# Patient Record
Sex: Female | Born: 1995 | Race: Black or African American | Hispanic: No | Marital: Single | Smoking: Never smoker
Health system: Southern US, Community
[De-identification: ages and names within clinical notes are randomized; demographics above are authoritative.]

## PROBLEM LIST (undated history)

## (undated) ENCOUNTER — Inpatient Hospital Stay (HOSPITAL_COMMUNITY): Payer: Self-pay

## (undated) DIAGNOSIS — O139 Gestational [pregnancy-induced] hypertension without significant proteinuria, unspecified trimester: Secondary | ICD-10-CM

## (undated) HISTORY — PX: NO PAST SURGERIES: SHX2092

---
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
## 2016-02-24 ENCOUNTER — Encounter: Payer: Self-pay | Admitting: Emergency Medicine

## 2016-02-24 ENCOUNTER — Emergency Department: Payer: Medicaid Other

## 2016-02-24 ENCOUNTER — Emergency Department
Admission: EM | Admit: 2016-02-24 | Discharge: 2016-02-24 | Disposition: A | Discharge status: Home / Self Care | Payer: Medicaid Other | Attending: Emergency Medicine | Admitting: Emergency Medicine

## 2016-02-24 DIAGNOSIS — S62639B Displaced fracture of distal phalanx of unspecified finger, initial encounter for open fracture: Secondary | ICD-10-CM

## 2016-02-24 DIAGNOSIS — W228XXA Striking against or struck by other objects, initial encounter: Secondary | ICD-10-CM | POA: Insufficient documentation

## 2016-02-24 DIAGNOSIS — S62631B Displaced fracture of distal phalanx of left index finger, initial encounter for open fracture: Secondary | ICD-10-CM | POA: Insufficient documentation

## 2016-02-24 DIAGNOSIS — Y999 Unspecified external cause status: Secondary | ICD-10-CM | POA: Insufficient documentation

## 2016-02-24 DIAGNOSIS — Y939 Activity, unspecified: Secondary | ICD-10-CM | POA: Insufficient documentation

## 2016-02-24 DIAGNOSIS — Y929 Unspecified place or not applicable: Secondary | ICD-10-CM | POA: Insufficient documentation

## 2016-02-24 MED ORDER — SULFAMETHOXAZOLE-TRIMETHOPRIM 800-160 MG PO TABS
1.0000 | ORAL_TABLET | Freq: Once | ORAL | Status: AC
  Administered 2016-02-24: 1 via ORAL
  Filled 2016-02-24: qty 1

## 2016-02-24 MED ORDER — OXYCODONE-ACETAMINOPHEN 5-325 MG PO TABS
2.0000 | ORAL_TABLET | Freq: Once | ORAL | Status: AC
  Administered 2016-02-24: 2 via ORAL
  Filled 2016-02-24: qty 2

## 2016-02-24 MED ORDER — OXYCODONE-ACETAMINOPHEN 7.5-325 MG PO TABS: 1.0000 | ORAL_TABLET | ORAL | 0 refills | Status: DC | PRN

## 2016-02-24 MED ORDER — SULFAMETHOXAZOLE-TRIMETHOPRIM 800-160 MG PO TABS: 1.0000 | ORAL_TABLET | Freq: Two times a day (BID) | ORAL | 0 refills | Status: DC

## 2016-02-24 MED ORDER — LIDOCAINE HCL (PF) 1 % IJ SOLN
5.0000 mL | Freq: Once | INTRAMUSCULAR | Status: AC
  Administered 2016-02-24: 5 mL via INTRADERMAL

## 2016-02-24 MED ORDER — LIDOCAINE HCL (PF) 1 % IJ SOLN
INTRAMUSCULAR | Status: AC
  Administered 2016-02-24: 5 mL via INTRADERMAL
  Filled 2016-02-24: qty 5

## 2016-02-24 NOTE — ED Triage Notes (Signed)
Patient states that about 20 minutes PTA she accidentally slammed her finger in the car door. Tip of patients left index finger was cut off when it was slammed in door.

## 2016-02-24 NOTE — ED Provider Notes (Signed)
[facility] Emergency Department Provider Note   ____________________________________________   None    (approximate)  I have reviewed the triage vital signs and the nursing notes.   HISTORY  Chief Complaint Finger Injury    HPI Tami Murphy is a 21 y.o. female patient complain distal amputation of left index finger. Patient stated pain was "in a car door. Patient arrived with amputated part. Incident occurred approximately 20 minutes prior to arrival. Amputated part was placed on ice. Patient is right-hand dominant.Patient requests reattachment of amputation of part. Patient rates the pain as 8/10. Patient described a pain as "sharp". No palliative measures prior to arrival except for bandage. Patient is right-hand dominant.   History reviewed. No pertinent past medical history.  There are no active problems to display for this patient.   History reviewed. No pertinent surgical history.  Prior to Admission medications   Medication Sig Start Date End Date Taking? Authorizing Provider  naproxen (NAPROSYN) 500 MG tablet Take 1 tablet (500 mg total) by mouth 2 (two) times daily with a meal. 07/01/15   Jami L Hagler, PA-C  oxyCODONE-acetaminophen (PERCOCET) 7.5-325 MG tablet Take 1 tablet by mouth every 4 (four) hours as needed for severe pain. 02/24/16   Joni Reiningonald K Smith, PA-C  sulfamethoxazole-trimethoprim (BACTRIM DS,SEPTRA DS) 800-160 MG tablet Take 1 tablet by mouth 2 (two) times daily. 02/24/16   Joni Reiningonald K Smith, PA-C    Allergies Patient has no known allergies.  No family history on file.  Social History Social History  Substance Use Topics  . Smoking status: Never Smoker  . Smokeless tobacco: Never Used  . Alcohol use No    Review of Systems Constitutional: No fever/chills Eyes: No visual changes. ENT: No sore throat. Cardiovascular: Denies chest pain. Respiratory: Denies shortness of breath. Gastrointestinal: No abdominal pain.  No  nausea, no vomiting.  No diarrhea.  No constipation. Genitourinary: Negative for dysuria. Musculoskeletal: Negative for back pain. Skin: Negative for rash. Neurological: Negative for headaches, focal weakness or numbness.    ____________________________________________   PHYSICAL EXAM:  VITAL SIGNS: ED Triage Vitals  Enc Vitals Group     BP 02/24/16 1418 134/79     Pulse Rate 02/24/16 1418 (!) 120     Resp 02/24/16 1418 16     Temp 02/24/16 1418 98.2 F (36.8 C)     Temp Source 02/24/16 1418 Oral     SpO2 02/24/16 1418 100 %     Weight 02/24/16 1416 170 lb (77.1 kg)     Height 02/24/16 1416 5\' 6"  (1.676 m)     Head Circumference --      Peak Flow --      Pain Score 02/24/16 1416 8     Pain Loc --      Pain Edu? --      Excl. in GC? --     Constitutional: Alert and oriented. Well appearing and in no acute distress. Eyes: Conjunctivae are normal. PERRL. EOMI. Head: Atraumatic. Nose: No congestion/rhinnorhea. Mouth/Throat: Mucous membranes are moist.  Oropharynx non-erythematous. Neck: No stridor.  No cervical spine tenderness to palpation. Hematological/Lymphatic/Immunilogical: No cervical lymphadenopathy. Cardiovascular: Normal rate, regular rhythm. Grossly normal heart sounds.  Good peripheral circulation. Respiratory: Normal respiratory effort.  No retractions. Lungs CTAB. Gastrointestinal: Soft and nontender. No distention. No abdominal bruits. No CVA tenderness. Musculoskeletal: No lower extremity tenderness nor edema.  No joint effusions. Neurologic:  Normal speech and language. No gross focal neurologic deficits are appreciated. No gait instability.  Skin:  Skin is warm, dry and intact. No rash noted. Psychiatric: Mood and affect are normal. Speech and behavior are normal.  ____________________________________________   LABS (all labs ordered are listed, but only abnormal results are displayed)  Labs Reviewed - No data to  display ____________________________________________  EKG   ____________________________________________  RADIOLOGY  X-ray shows too fracture with avulsion of soft tissue to the distal second digit left hand. ____________________________________________   PROCEDURES  Procedure(s) performed: None  Procedures  Critical Care performed: No  ____________________________________________   INITIAL IMPRESSION / ASSESSMENT AND PLAN / ED COURSE  Pertinent labs & imaging results that were available during my care of the patient were reviewed by me and considered in my medical decision making (see chart for details). Patient has open tuft fracture second digit left hand. Patient given discharge care instructions. Patient advised to wear splint May change dressing to evaluation by orthopedics. Advised call orthopedics in 2 days to schedule an appointment. Patient given a prescription for Bactrim DS and Percocets.      ____________________________________________   FINAL CLINICAL IMPRESSION(S) / ED DIAGNOSES  Final diagnoses:  Open fracture of tuft of distal phalanx of finger      NEW MEDICATIONS STARTED DURING THIS VISIT:  New Prescriptions   OXYCODONE-ACETAMINOPHEN (PERCOCET) 7.5-325 MG TABLET    Take 1 tablet by mouth every 4 (four) hours as needed for severe pain.   SULFAMETHOXAZOLE-TRIMETHOPRIM (BACTRIM DS,SEPTRA DS) 800-160 MG TABLET    Take 1 tablet by mouth 2 (two) times daily.     Note:  This document was prepared using Dragon voice recognition software and may include unintentional dictation errors.    Joni Reining, PA-C 02/24/16 1530    Sharman Cheek, MD 02/25/16 [phone]

## 2016-02-24 NOTE — Discharge Instructions (Signed)
Wear splint until evaluated by Ortho clinic.

## 2016-06-21 ENCOUNTER — Emergency Department
Admission: EM | Admit: 2016-06-21 | Discharge: 2016-06-21 | Disposition: A | Discharge status: Home / Self Care | Payer: Medicaid Other | Attending: Emergency Medicine | Admitting: Emergency Medicine

## 2016-06-21 ENCOUNTER — Encounter: Payer: Self-pay | Admitting: Emergency Medicine

## 2016-06-21 ENCOUNTER — Emergency Department: Payer: Medicaid Other

## 2016-06-21 DIAGNOSIS — J069 Acute upper respiratory infection, unspecified: Secondary | ICD-10-CM

## 2016-06-21 DIAGNOSIS — B9789 Other viral agents as the cause of diseases classified elsewhere: Secondary | ICD-10-CM

## 2016-06-21 DIAGNOSIS — R0789 Other chest pain: Secondary | ICD-10-CM

## 2016-06-21 LAB — POCT PREGNANCY, URINE: Preg Test, Ur: NEGATIVE

## 2016-06-21 LAB — URINALYSIS, ROUTINE W REFLEX MICROSCOPIC
Bilirubin Urine: NEGATIVE
GLUCOSE, UA: NEGATIVE mg/dL
HGB URINE DIPSTICK: NEGATIVE
Ketones, ur: NEGATIVE mg/dL
LEUKOCYTES UA: NEGATIVE
Nitrite: NEGATIVE
PH: 6 (ref 5.0–8.0)
Protein, ur: NEGATIVE mg/dL
SPECIFIC GRAVITY, URINE: 1.018 (ref 1.005–1.030)

## 2016-06-21 LAB — CBC
HEMATOCRIT: 40 % (ref 35.0–47.0)
HEMOGLOBIN: 13.3 g/dL (ref 12.0–16.0)
MCH: 26.7 pg (ref 26.0–34.0)
MCHC: 33.3 g/dL (ref 32.0–36.0)
MCV: 80.2 fL (ref 80.0–100.0)
Platelets: 218 10*3/uL (ref 150–440)
RBC: 4.99 MIL/uL (ref 3.80–5.20)
RDW: 14 % (ref 11.5–14.5)
WBC: 8.3 10*3/uL (ref 3.6–11.0)

## 2016-06-21 LAB — BASIC METABOLIC PANEL
ANION GAP: 7 (ref 5–15)
BUN: 8 mg/dL (ref 6–20)
CALCIUM: 9.5 mg/dL (ref 8.9–10.3)
CHLORIDE: 103 mmol/L (ref 101–111)
CO2: 26 mmol/L (ref 22–32)
Creatinine, Ser: 0.93 mg/dL (ref 0.44–1.00)
GFR calc Af Amer: >60 mL/min (ref >60)
GFR calc non Af Amer: >60 mL/min (ref >60)
GLUCOSE: 83 mg/dL (ref 65–99)
Potassium: 4.4 mmol/L (ref 3.5–5.1)
Sodium: 136 mmol/L (ref 135–145)

## 2016-06-21 LAB — TROPONIN I

## 2016-06-21 MED ORDER — BENZONATATE 100 MG PO CAPS: 100.0000 mg | ORAL_CAPSULE | Freq: Three times a day (TID) | ORAL | 0 refills | Status: DC | PRN

## 2016-06-21 MED ORDER — CYCLOBENZAPRINE HCL 5 MG PO TABS: 5.0000 mg | ORAL_TABLET | Freq: Three times a day (TID) | ORAL | 0 refills | Status: DC | PRN

## 2016-06-21 MED ORDER — ALBUTEROL SULFATE HFA 108 (90 BASE) MCG/ACT IN AERS: 2.0000 | INHALATION_SPRAY | Freq: Four times a day (QID) | RESPIRATORY_TRACT | 0 refills | Status: DC | PRN

## 2016-06-21 MED ORDER — CYCLOBENZAPRINE HCL 10 MG PO TABS
5.0000 mg | ORAL_TABLET | Freq: Once | ORAL | Status: AC
  Administered 2016-06-21: 5 mg via ORAL
  Filled 2016-06-21: qty 1

## 2016-06-21 MED ORDER — IPRATROPIUM-ALBUTEROL 0.5-2.5 (3) MG/3ML IN SOLN
3.0000 mL | Freq: Once | RESPIRATORY_TRACT | Status: AC
  Administered 2016-06-21: 3 mL via RESPIRATORY_TRACT
  Filled 2016-06-21: qty 3

## 2016-06-21 NOTE — ED Triage Notes (Signed)
Pt arrived via EMS for reports of left side chest and arm pain. Pt reports non productive cough for five weeks. Pt reports left side chest pain and under her breast when lifting or upon rest. Pt in no apparent distress in triage.

## 2016-06-21 NOTE — Discharge Instructions (Signed)
Your exam, labs, chest x-ray, and EKG are all normal at this time. There does not appear to be a serious, infectious, cardiac, or pulmonary cause for your cough and chest wall pain. You will be treated for a bronchospasm which is likely due to a virus. Your chest wall/arm muscle pain will likely respond to the muscle relaxants. Take the prescription meds as directed. Follow-up with your provider for continued symptoms.

## 2016-06-21 NOTE — ED Provider Notes (Signed)
[facility] Emergency Department Provider Note ____________________________________________  Time seen: 1611  I have reviewed the triage vital signs and the nursing notes.  HISTORY  Chief Complaint  Chest Pain and Cough  HPI Tami Murphy is a 21 y.o. female presents to the ED for evaluation of a 5 week complaint of intermittent cough.She describes a nonproductive cough that has been persistent and intermittent the last 5 weeks. She denies any hemoptysis, shortness of breath, or wheezing. She describes today she had the onset of some left-sided chest wall pain as well as some referral into the left upper extremity. She describes the pain to the left chest is worse with lifting or upper extremity. She denies any recent travel, distal paresthesias, or diaphoresis. Patient is a nonsmoker and has no significant medical history.  History reviewed. No pertinent past medical history.  There are no active problems to display for this patient.  No past surgical history on file.  Prior to Admission medications   Medication Sig Start Date End Date Taking? Authorizing Provider  albuterol (PROVENTIL HFA;VENTOLIN HFA) 108 (90 Base) MCG/ACT inhaler Inhale 2 puffs into the lungs every 6 (six) hours as needed for wheezing or shortness of breath. 06/21/16   Coda Mathey, Charlesetta IvoryJenise V Bacon, PA-C  benzonatate (TESSALON PERLES) 100 MG capsule Take 1 capsule (100 mg total) by mouth 3 (three) times daily as needed for cough (Take 1-2 per dose). 06/21/16   Gunnison Chahal, Charlesetta IvoryJenise V Bacon, PA-C  cyclobenzaprine (FLEXERIL) 5 MG tablet Take 1 tablet (5 mg total) by mouth 3 (three) times daily as needed for muscle spasms. 06/21/16   Ailis Rigaud, Charlesetta IvoryJenise V Bacon, PA-C  naproxen (NAPROSYN) 500 MG tablet Take 1 tablet (500 mg total) by mouth 2 (two) times daily with a meal. 07/01/15   Hagler, Jami L, PA-C  oxyCODONE-acetaminophen (PERCOCET) 7.5-325 MG tablet Take 1 tablet by mouth every 4 (four) hours as needed for severe  pain. 02/24/16   Joni ReiningSmith, Ronald K, PA-C  sulfamethoxazole-trimethoprim (BACTRIM DS,SEPTRA DS) 800-160 MG tablet Take 1 tablet by mouth 2 (two) times daily. 02/24/16   Joni ReiningSmith, Ronald K, PA-C    Allergies Bactrim [sulfamethoxazole-trimethoprim]  No family history on file.  Social History Social History  Substance Use Topics  . Smoking status: Never Smoker  . Smokeless tobacco: Never Used  . Alcohol use No    Review of Systems  Constitutional: Negative for fever. Eyes: Negative for visual changes. ENT: Negative for sore throat. Cardiovascular: Negative for chest pain. Respiratory: Negative for shortness of breath. Gastrointestinal: Negative for abdominal pain, vomiting and diarrhea. Genitourinary: Negative for dysuria. Musculoskeletal: Negative for back pain. Skin: Negative for rash. Neurological: Negative for headaches, focal weakness or numbness. ____________________________________________  PHYSICAL EXAM:  VITAL SIGNS: ED Triage Vitals [06/21/16 1314]  Enc Vitals Group     BP 117/83     Pulse Rate 74     Resp 18     Temp 98.1 F (36.7 C)     Temp Source Oral     SpO2 100 %     Weight 170 lb (77.1 kg)     Height 5\' 6"  (1.676 m)     Head Circumference      Peak Flow      Pain Score 7     Pain Loc      Pain Edu?      Excl. in GC?     Constitutional: Alert and oriented. Well appearing and in no distress. Head: Normocephalic and atraumatic. Eyes: Conjunctivae are  normal. PERRL. Normal extraocular movements Ears: Canals clear. TMs intact bilaterally. Nose: No congestion/rhinorrhea/epistaxis. Mouth/Throat: Mucous membranes are moist. Neck: Supple. No thyromegaly. Hematological/Lymphatic/Immunological: No cervical lymphadenopathy. Cardiovascular: Normal rate, regular rhythm. Normal distal pulses. Respiratory: Normal respiratory effort. No wheezes/rales/rhonchi. Gastrointestinal: Soft and nontender. No distention. Musculoskeletal: Minimally tender to palp over the  anterior pectoralis muscle on the left. Normal rotator cuff testing.  Nontender with normal range of motion in all extremities.  Neurologic: CN II-XII grossly intact. Normal gait without ataxia. Normal speech and language. No gross focal neurologic deficits are appreciated. Skin:  Skin is warm, dry and intact. No rash noted. Psychiatric: Mood and affect are normal. Patient exhibits appropriate insight and judgment. ____________________________________________   LABS (pertinent positives/negatives)  Labs Reviewed  URINALYSIS, ROUTINE W REFLEX MICROSCOPIC - Abnormal; Notable for the following:       Result Value   Color, Urine YELLOW (*)    APPearance CLEAR (*)    All other components within normal limits  BASIC METABOLIC PANEL  CBC  TROPONIN I  POCT PREGNANCY, URINE  ____________________________________________  EKG  NSR No STEMI ____________________________________________   RADIOLOGY  CXR  IMPRESSION: No active cardiopulmonary disease. ____________________________________________  PROCEDURES  Flexeril 5 mg PO DuoNeb x 1 ____________________________________________  INITIAL IMPRESSION / ASSESSMENT AND PLAN / ED COURSE  Patient presents to the ED for evaluation of 3-4 weeks of nonproductive intermittent cough that is worsened over the last 24 hours. She also had a second episode of substernal chest tightness with some referral into the left upper shoulder. Her exam, chest x-ray, labs, EKG are reassuring at this time is negative for any acute cardiac injury. Patient is reassured by her exam, labs, and imaging. She also reports movement of her symptoms after her breathing treatment in the ED and oral dose of muscle relaxant. She will be discharged with prescriptions for Tessalon Perles, albuterol inhaler, and cyclobenzaprine. She is advised to follow-up with the primary care provider or return to the ED for acutely worsening  symptoms. ____________________________________________  FINAL CLINICAL IMPRESSION(S) / ED DIAGNOSES  Final diagnoses:  Chest wall pain  Viral URI with cough      Karmen Stabs, Charlesetta Ivory, PA-C 06/21/16 1924    Jene Every, MD 06/21/16 2011

## 2016-06-21 NOTE — ED Notes (Signed)

## 2016-11-07 LAB — OB RESULTS CONSOLE ANTIBODY SCREEN: Antibody Screen: NEGATIVE

## 2016-11-07 LAB — OB RESULTS CONSOLE HIV ANTIBODY (ROUTINE TESTING): HIV: NONREACTIVE

## 2016-11-07 LAB — OB RESULTS CONSOLE ABO/RH: RH TYPE: POSITIVE

## 2016-11-07 LAB — OB RESULTS CONSOLE RPR: RPR: NONREACTIVE

## 2016-11-07 LAB — OB RESULTS CONSOLE RUBELLA ANTIBODY, IGM: RUBELLA: IMMUNE

## 2016-11-07 LAB — OB RESULTS CONSOLE HEPATITIS B SURFACE ANTIGEN: HEP B S AG: NEGATIVE

## 2016-11-07 LAB — OB RESULTS CONSOLE GC/CHLAMYDIA
Chlamydia: NEGATIVE
Gonorrhea: NEGATIVE

## 2016-12-04 ENCOUNTER — Other Ambulatory Visit (HOSPITAL_COMMUNITY): Payer: Self-pay | Admitting: Nurse Practitioner

## 2016-12-04 DIAGNOSIS — Z363 Encounter for antenatal screening for malformations: Secondary | ICD-10-CM

## 2016-12-05 ENCOUNTER — Encounter (HOSPITAL_COMMUNITY): Payer: Self-pay | Admitting: Nurse Practitioner

## 2016-12-10 ENCOUNTER — Ambulatory Visit (HOSPITAL_COMMUNITY)
Admission: RE | Admit: 2016-12-10 | Discharge: 2016-12-10 | Disposition: A | Discharge status: Home / Self Care | Payer: Medicaid Other | Source: Ambulatory Visit | Attending: Nurse Practitioner | Admitting: Nurse Practitioner

## 2016-12-10 ENCOUNTER — Encounter: Payer: Self-pay | Admitting: *Deleted

## 2016-12-10 ENCOUNTER — Other Ambulatory Visit (HOSPITAL_COMMUNITY): Payer: Self-pay | Admitting: Nurse Practitioner

## 2016-12-10 ENCOUNTER — Encounter (HOSPITAL_COMMUNITY): Payer: Self-pay

## 2016-12-10 DIAGNOSIS — O99212 Obesity complicating pregnancy, second trimester: Secondary | ICD-10-CM | POA: Diagnosis not present

## 2016-12-10 DIAGNOSIS — E669 Obesity, unspecified: Secondary | ICD-10-CM | POA: Insufficient documentation

## 2016-12-10 DIAGNOSIS — Z3A19 19 weeks gestation of pregnancy: Secondary | ICD-10-CM | POA: Insufficient documentation

## 2016-12-10 DIAGNOSIS — Z363 Encounter for antenatal screening for malformations: Secondary | ICD-10-CM | POA: Diagnosis present

## 2017-01-07 NOTE — L&D Delivery Note (Signed)
Delivery Note At 7:08 PM a viable and healthy female was delivered via Vaginal, Spontaneous (Presentation:ROA ).  APGAR: 8, 9; weight 7 lb 15 oz (3600 g).   Placenta status: spontaneous ,intact .  Cord: 3 vessel with the following complications:none .    Anesthesia:  epidural Episiotomy: None Lacerations: None Suture Repair: n/a Est. Blood Loss (mL): 200  Mom to postpartum.  Baby to Couplet care / Skin to Skin.  Tami Murphy is a 22 y.o. female G1P1001 with IUP at 4430w4d admitted for IOL for gHTN.  She progressed with augmentation to complete and pushed less than 1 hour to deliver.  Cord clamping delayed by 1-3 minutes then clamped by CNM and cut by FOB.  Placenta intact and spontaneous, bleeding minimal. Intact perineum.  Mom and baby stable prior to transfer to postpartum. She plans on breastfeeding. She requests Depo for birth control.   Tami CounterLisa Murphy 04/22/2017, 8:49 PM

## 2017-04-01 ENCOUNTER — Inpatient Hospital Stay (HOSPITAL_COMMUNITY)
Admission: AD | Admit: 2017-04-01 | Discharge: 2017-04-01 | Disposition: A | Discharge status: Home / Self Care | Payer: Medicaid Other | Source: Ambulatory Visit | Attending: Family Medicine | Admitting: Family Medicine

## 2017-04-01 ENCOUNTER — Encounter (HOSPITAL_COMMUNITY): Payer: Self-pay

## 2017-04-01 DIAGNOSIS — Z3689 Encounter for other specified antenatal screening: Secondary | ICD-10-CM | POA: Diagnosis not present

## 2017-04-01 DIAGNOSIS — Z3A35 35 weeks gestation of pregnancy: Secondary | ICD-10-CM

## 2017-04-01 DIAGNOSIS — Z882 Allergy status to sulfonamides status: Secondary | ICD-10-CM | POA: Diagnosis not present

## 2017-04-01 DIAGNOSIS — O4703 False labor before 37 completed weeks of gestation, third trimester: Secondary | ICD-10-CM

## 2017-04-01 LAB — URINALYSIS, ROUTINE W REFLEX MICROSCOPIC
Bilirubin Urine: NEGATIVE
Glucose, UA: NEGATIVE mg/dL
Hgb urine dipstick: NEGATIVE
Ketones, ur: 5 mg/dL — AB
Nitrite: NEGATIVE
PROTEIN: NEGATIVE mg/dL
SPECIFIC GRAVITY, URINE: 1.009 (ref 1.005–1.030)
pH: 7 (ref 5.0–8.0)

## 2017-04-01 NOTE — MAU Note (Signed)
Pt has been having ctx since last night. About 3 minutes apart. Pain 7/10. No LOF or bleeding + FM

## 2017-04-01 NOTE — Discharge Instructions (Signed)
Braxton Hicks Contractions °Contractions of the uterus can occur throughout pregnancy, but they are not always a sign that you are in labor. You may have practice contractions called Braxton Hicks contractions. These false labor contractions are sometimes confused with true labor. °What are Braxton Hicks contractions? °Braxton Hicks contractions are tightening movements that occur in the muscles of the uterus before labor. Unlike true labor contractions, these contractions do not result in opening (dilation) and thinning of the cervix. Toward the end of pregnancy (32-34 weeks), Braxton Hicks contractions can happen more often and may become stronger. These contractions are sometimes difficult to tell apart from true labor because they can be very uncomfortable. You should not feel embarrassed if you go to the hospital with false labor. °Sometimes, the only way to tell if you are in true labor is for your health care provider to look for changes in the cervix. The health care provider will do a physical exam and may monitor your contractions. If you are not in true labor, the exam should show that your cervix is not dilating and your water has not broken. °If there are other health problems associated with your pregnancy, it is completely safe for you to be sent home with false labor. You may continue to have Braxton Hicks contractions until you go into true labor. °How to tell the difference between true labor and false labor °True labor °· Contractions last 30-70 seconds. °· Contractions become very regular. °· Discomfort is usually felt in the top of the uterus, and it spreads to the lower abdomen and low back. °· Contractions do not go away with walking. °· Contractions usually become more intense and increase in frequency. °· The cervix dilates and gets thinner. °False labor °· Contractions are usually shorter and not as strong as true labor contractions. °· Contractions are usually irregular. °· Contractions  are often felt in the front of the lower abdomen and in the groin. °· Contractions may go away when you walk around or change positions while lying down. °· Contractions get weaker and are shorter-lasting as time goes on. °· The cervix usually does not dilate or become thin. °Follow these instructions at home: °· Take over-the-counter and prescription medicines only as told by your health care provider. °· Keep up with your usual exercises and follow other instructions from your health care provider. °· Eat and drink lightly if you think you are going into labor. °· If Braxton Hicks contractions are making you uncomfortable: °? Change your position from lying down or resting to walking, or change from walking to resting. °? Sit and rest in a tub of warm water. °? Drink enough fluid to keep your urine pale yellow. Dehydration may cause these contractions. °? Do slow and deep breathing several times an hour. °· Keep all follow-up prenatal visits as told by your health care provider. This is important. °Contact a health care provider if: °· You have a fever. °· You have continuous pain in your abdomen. °Get help right away if: °· Your contractions become stronger, more regular, and closer together. °· You have fluid leaking or gushing from your vagina. °· You pass blood-tinged mucus (bloody show). °· You have bleeding from your vagina. °· You have low back pain that you never had before. °· You feel your baby’s head pushing down and causing pelvic pressure. °· Your baby is not moving inside you as much as it used to. °Summary °· Contractions that occur before labor are called Braxton   Hicks contractions, false labor, or practice contractions. °· Braxton Hicks contractions are usually shorter, weaker, farther apart, and less regular than true labor contractions. True labor contractions usually become progressively stronger and regular and they become more frequent. °· Manage discomfort from Braxton Hicks contractions by  changing position, resting in a warm bath, drinking plenty of water, or practicing deep breathing. °This information is not intended to replace advice given to you by your health care provider. Make sure you discuss any questions you have with your health care provider. °Document Released: 05/09/2016 Document Revised: 05/09/2016 Document Reviewed: 05/09/2016 °Elsevier Interactive Patient Education © 2018 [employer] ° °

## 2017-04-01 NOTE — MAU Provider Note (Signed)
History     CSN: [id number]  Arrival date and time: 04/01/17 1027   First Provider Initiated Contact with Patient 04/01/17 1006      Chief Complaint  Patient presents with  . Contractions   G1 @35 .4 wks here with ctx. Ctx started last night and were q2 min but since this morning are much less often, she cannot tell frequency. Denies VB or LOF. Good FM. No urinary sx. She is eating and drinking well. Her pregnacncy has been uncomplicated.   OB History    Gravida  1   Para      Term      Preterm      AB      Living        SAB      TAB      Ectopic      Multiple      Live Births              Past Medical History:  Diagnosis Date  . Medical history non-contributory     Past Surgical History:  Procedure Laterality Date  . NO PAST SURGERIES      History reviewed. No pertinent family history.  Social History   Tobacco Use  . Smoking status: Never Smoker  . Smokeless tobacco: Never Used  Substance Use Topics  . Alcohol use: No  . Drug use: No    Allergies:  Allergies  Allergen Reactions  . Bactrim [Sulfamethoxazole-Trimethoprim] Hives    Medications Prior to Admission  Medication Sig Dispense Refill Last Dose  . Prenatal Vit-Fe Fumarate-FA (PRENATAL VITAMIN PO) Take 1 tablet by mouth daily.    03/31/2017 at Unknown time  . albuterol (PROVENTIL HFA;VENTOLIN HFA) 108 (90 Base) MCG/ACT inhaler Inhale 2 puffs into the lungs every 6 (six) hours as needed for wheezing or shortness of breath. (Patient not taking: Reported on 12/10/2016) 1 Inhaler 0 Not Taking    Review of Systems  Gastrointestinal: Positive for abdominal pain.  Genitourinary: Negative for vaginal bleeding and vaginal discharge.   Physical Exam   Blood pressure 129/78, pulse (!) 108, temperature 98.7 F (37.1 C), temperature source Oral, resp. rate 18, height 5\' 6"  (1.676 m), weight 203 lb (92.1 kg), last menstrual period 07/29/2016, SpO2 97 %.  Physical Exam  Nursing note and  vitals reviewed. Constitutional: She is oriented to person, place, and time. She appears well-developed and well-nourished. No distress.  HENT:  Head: Normocephalic and atraumatic.  Neck: Normal range of motion.  Respiratory: Effort normal. No respiratory distress.  GI: Soft. She exhibits no distension. There is no tenderness.  gravid  Genitourinary:  Genitourinary Comments: SVE closed/thick  Musculoskeletal: Normal range of motion.  Neurological: She is alert and oriented to person, place, and time.  Skin: Skin is warm and dry.  Psychiatric: She has a normal mood and affect.  EFM: 150 bpm, mod variability, + accels, no decels Toco: irritability, irregular ctx  Results for orders placed or performed during the hospital encounter of 04/01/17 (from the past 24 hour(s))  Urinalysis, Routine w reflex microscopic     Status: Abnormal   Collection Time: 04/01/17  9:35 AM  Result Value Ref Range   Color, Urine YELLOW YELLOW   APPearance CLEAR CLEAR   Specific Gravity, Urine 1.009 1.005 - 1.030   pH 7.0 5.0 - 8.0   Glucose, UA NEGATIVE NEGATIVE mg/dL   Hgb urine dipstick NEGATIVE NEGATIVE   Bilirubin Urine NEGATIVE NEGATIVE   Ketones, ur 5 (A)  NEGATIVE mg/dL   Protein, ur NEGATIVE NEGATIVE mg/dL   Nitrite NEGATIVE NEGATIVE   Leukocytes, UA TRACE (A) NEGATIVE   RBC / HPF 0-5 0 - 5 RBC/hpf   WBC, UA 0-5 0 - 5 WBC/hpf   Bacteria, UA RARE (A) NONE SEEN   Squamous Epithelial / LPF 6-30 (A) NONE SEEN   Mucus PRESENT    MAU Course  Procedures  MDM Labs ordered and reviewed. No evidence of PTL or UTI. Stable for discharge home.  Assessment and Plan   1. [redacted] weeks gestation of pregnancy   2. NST (non-stress test) reactive   3. Preterm uterine contractions in third trimester, antepartum    [facility] scheduled PTL precautions  Allergies as of 04/01/2017      Reactions   Bactrim [sulfamethoxazole-trimethoprim] Hives      Medication List     TAKE these medications   albuterol 108 (90 Base) MCG/ACT inhaler Commonly known as:  PROVENTIL HFA;VENTOLIN HFA Inhale 2 puffs into the lungs every 6 (six) hours as needed for wheezing or shortness of breath.   PRENATAL VITAMIN PO Take 1 tablet by mouth daily.       Donette LarryMelanie Paysen Goza, CNM 04/01/2017, 10:22 AM

## 2017-04-07 LAB — OB RESULTS CONSOLE GC/CHLAMYDIA
Chlamydia: POSITIVE
Gonorrhea: NEGATIVE

## 2017-04-07 LAB — OB RESULTS CONSOLE GBS: STREP GROUP B AG: POSITIVE

## 2017-04-21 ENCOUNTER — Inpatient Hospital Stay (HOSPITAL_COMMUNITY): Payer: Medicaid Other | Admitting: Anesthesiology

## 2017-04-21 ENCOUNTER — Inpatient Hospital Stay (HOSPITAL_COMMUNITY)
Admission: AD | Admit: 2017-04-21 | Discharge: 2017-04-24 | DRG: 807 | Disposition: A | Discharge status: Home / Self Care | Payer: Medicaid Other | Source: Ambulatory Visit | Attending: Obstetrics and Gynecology | Admitting: Obstetrics and Gynecology

## 2017-04-21 ENCOUNTER — Encounter (HOSPITAL_COMMUNITY): Payer: Self-pay | Admitting: *Deleted

## 2017-04-21 DIAGNOSIS — O133 Gestational [pregnancy-induced] hypertension without significant proteinuria, third trimester: Secondary | ICD-10-CM | POA: Diagnosis not present

## 2017-04-21 DIAGNOSIS — O99214 Obesity complicating childbirth: Secondary | ICD-10-CM | POA: Diagnosis present

## 2017-04-21 DIAGNOSIS — E669 Obesity, unspecified: Secondary | ICD-10-CM | POA: Diagnosis present

## 2017-04-21 DIAGNOSIS — O134 Gestational [pregnancy-induced] hypertension without significant proteinuria, complicating childbirth: Secondary | ICD-10-CM | POA: Diagnosis present

## 2017-04-21 DIAGNOSIS — Z3A38 38 weeks gestation of pregnancy: Secondary | ICD-10-CM | POA: Diagnosis not present

## 2017-04-21 DIAGNOSIS — O139 Gestational [pregnancy-induced] hypertension without significant proteinuria, unspecified trimester: Secondary | ICD-10-CM | POA: Diagnosis present

## 2017-04-21 DIAGNOSIS — O99824 Streptococcus B carrier state complicating childbirth: Secondary | ICD-10-CM | POA: Diagnosis present

## 2017-04-21 HISTORY — DX: Gestational (pregnancy-induced) hypertension without significant proteinuria, unspecified trimester: O13.9

## 2017-04-21 LAB — COMPREHENSIVE METABOLIC PANEL
ALBUMIN: 2.9 g/dL — AB (ref 3.5–5.0)
ALK PHOS: 211 U/L — AB (ref 38–126)
ALT: 18 U/L (ref 14–54)
ANION GAP: 8 (ref 5–15)
AST: 22 U/L (ref 15–41)
BUN: 7 mg/dL (ref 6–20)
CHLORIDE: 110 mmol/L (ref 101–111)
CO2: 22 mmol/L (ref 22–32)
Calcium: 9 mg/dL (ref 8.9–10.3)
Creatinine, Ser: 0.79 mg/dL (ref 0.44–1.00)
GFR calc non Af Amer: >60 mL/min (ref >60)
GLUCOSE: 84 mg/dL (ref 65–99)
POTASSIUM: 4.1 mmol/L (ref 3.5–5.1)
SODIUM: 140 mmol/L (ref 135–145)
Total Bilirubin: 0.6 mg/dL (ref 0.3–1.2)
Total Protein: 7 g/dL (ref 6.5–8.1)

## 2017-04-21 LAB — CBC
HCT: 34.2 % — ABNORMAL LOW (ref 36.0–46.0)
HCT: 37 % (ref 36.0–46.0)
HEMOGLOBIN: 11.7 g/dL — AB (ref 12.0–15.0)
Hemoglobin: 12.8 g/dL (ref 12.0–15.0)
MCH: 26.9 pg (ref 26.0–34.0)
MCH: 27.1 pg (ref 26.0–34.0)
MCHC: 34.2 g/dL (ref 30.0–36.0)
MCHC: 34.6 g/dL (ref 30.0–36.0)
MCV: 78.6 fL (ref 78.0–100.0)
MCV: 78.4 fL (ref 78.0–100.0)
PLATELETS: 111 10*3/uL — AB (ref 150–400)
Platelets: 123 10*3/uL — ABNORMAL LOW (ref 150–400)
RBC: 4.35 MIL/uL (ref 3.87–5.11)
RBC: 4.72 MIL/uL (ref 3.87–5.11)
RDW: 14.3 % (ref 11.5–15.5)
RDW: 14.3 % (ref 11.5–15.5)
WBC: 7 10*3/uL (ref 4.0–10.5)
WBC: 7.8 10*3/uL (ref 4.0–10.5)

## 2017-04-21 LAB — PROTEIN / CREATININE RATIO, URINE
Creatinine, Urine: 45 mg/dL
PROTEIN CREATININE RATIO: 0.13 mg/mg{creat} (ref 0.00–0.15)
Total Protein, Urine: 6 mg/dL

## 2017-04-21 LAB — ABO/RH: ABO/RH(D): B POS

## 2017-04-21 LAB — TYPE AND SCREEN
ABO/RH(D): B POS
Antibody Screen: NEGATIVE

## 2017-04-21 MED ORDER — LIDOCAINE HCL (PF) 1 % IJ SOLN
INTRAMUSCULAR | Status: DC | PRN
  Administered 2017-04-21: 8 mL via EPIDURAL
  Administered 2017-04-21: 4 mL via EPIDURAL

## 2017-04-21 MED ORDER — PHENYLEPHRINE 40 MCG/ML (10ML) SYRINGE FOR IV PUSH (FOR BLOOD PRESSURE SUPPORT)
80.0000 ug | PREFILLED_SYRINGE | INTRAVENOUS | Status: DC | PRN
  Filled 2017-04-21: qty 5

## 2017-04-21 MED ORDER — PHENYLEPHRINE 40 MCG/ML (10ML) SYRINGE FOR IV PUSH (FOR BLOOD PRESSURE SUPPORT)
PREFILLED_SYRINGE | INTRAVENOUS | Status: AC
  Filled 2017-04-21: qty 20

## 2017-04-21 MED ORDER — FENTANYL 2.5 MCG/ML BUPIVACAINE 1/10 % EPIDURAL INFUSION (WH - ANES)
INTRAMUSCULAR | Status: AC
  Filled 2017-04-21: qty 100

## 2017-04-21 MED ORDER — FENTANYL CITRATE (PF) 100 MCG/2ML IJ SOLN: 100.0000 ug | INTRAMUSCULAR | Status: DC | PRN

## 2017-04-21 MED ORDER — FENTANYL 2.5 MCG/ML BUPIVACAINE 1/10 % EPIDURAL INFUSION (WH - ANES)
14.0000 mL/h | INTRAMUSCULAR | Status: DC | PRN
  Administered 2017-04-21 – 2017-04-22 (×3): 14 mL/h via EPIDURAL
  Filled 2017-04-21 (×2): qty 100

## 2017-04-21 MED ORDER — DIPHENHYDRAMINE HCL 50 MG/ML IJ SOLN: 12.5000 mg | INTRAMUSCULAR | Status: DC | PRN

## 2017-04-21 MED ORDER — SODIUM CHLORIDE 0.9 % IV SOLN
5.0000 10*6.[IU] | Freq: Once | INTRAVENOUS | Status: AC
  Administered 2017-04-21: 5 10*6.[IU] via INTRAVENOUS
  Filled 2017-04-21: qty 5

## 2017-04-21 MED ORDER — ACETAMINOPHEN 325 MG PO TABS: 650.0000 mg | ORAL_TABLET | ORAL | Status: DC | PRN

## 2017-04-21 MED ORDER — LIDOCAINE HCL (PF) 1 % IJ SOLN
30.0000 mL | INTRAMUSCULAR | Status: DC | PRN
  Filled 2017-04-21: qty 30

## 2017-04-21 MED ORDER — OXYCODONE-ACETAMINOPHEN 5-325 MG PO TABS: 2.0000 | ORAL_TABLET | ORAL | Status: DC | PRN

## 2017-04-21 MED ORDER — MISOPROSTOL 50MCG HALF TABLET
50.0000 ug | ORAL_TABLET | ORAL | Status: DC | PRN
  Administered 2017-04-21 (×2): 50 ug via BUCCAL
  Filled 2017-04-21 (×3): qty 1

## 2017-04-21 MED ORDER — ONDANSETRON HCL 4 MG/2ML IJ SOLN: 4.0000 mg | Freq: Four times a day (QID) | INTRAMUSCULAR | Status: DC | PRN

## 2017-04-21 MED ORDER — LACTATED RINGERS IV SOLN: 500.0000 mL | Freq: Once | INTRAVENOUS | Status: DC

## 2017-04-21 MED ORDER — LACTATED RINGERS IV SOLN
INTRAVENOUS | Status: DC
  Administered 2017-04-21 – 2017-04-22 (×4): via INTRAVENOUS

## 2017-04-21 MED ORDER — OXYCODONE-ACETAMINOPHEN 5-325 MG PO TABS: 1.0000 | ORAL_TABLET | ORAL | Status: DC | PRN

## 2017-04-21 MED ORDER — PENICILLIN G POT IN DEXTROSE 60000 UNIT/ML IV SOLN
3.0000 10*6.[IU] | INTRAVENOUS | Status: DC
Start: 2017-04-21 — End: 2017-04-22
  Administered 2017-04-21 – 2017-04-22 (×7): 3 10*6.[IU] via INTRAVENOUS
  Filled 2017-04-21 (×9): qty 50

## 2017-04-21 MED ORDER — OXYTOCIN BOLUS FROM INFUSION
500.0000 mL | Freq: Once | INTRAVENOUS | Status: AC
  Administered 2017-04-22: 500 mL via INTRAVENOUS

## 2017-04-21 MED ORDER — EPHEDRINE 5 MG/ML INJ
10.0000 mg | INTRAVENOUS | Status: DC | PRN
  Filled 2017-04-21: qty 2

## 2017-04-21 MED ORDER — TERBUTALINE SULFATE 1 MG/ML IJ SOLN: 0.2500 mg | Freq: Once | INTRAMUSCULAR | Status: DC | PRN

## 2017-04-21 MED ORDER — SOD CITRATE-CITRIC ACID 500-334 MG/5ML PO SOLN: 30.0000 mL | ORAL | Status: DC | PRN

## 2017-04-21 MED ORDER — OXYTOCIN 40 UNITS IN LACTATED RINGERS INFUSION - SIMPLE MED
2.5000 [IU]/h | INTRAVENOUS | Status: DC
  Filled 2017-04-21 (×2): qty 1000

## 2017-04-21 MED ORDER — LACTATED RINGERS IV SOLN: 500.0000 mL | INTRAVENOUS | Status: DC | PRN

## 2017-04-21 NOTE — MAU Note (Signed)
[facility], had HA yesterday, none now.  Denies visual changes or epigastric pain.  Denies contractions, bleeding or LOF.  Does have back pain.  Reports good fetal movement.

## 2017-04-21 NOTE — Progress Notes (Signed)
Tami Murphy is a 22 y.o. G1P0 at 2058w3d  admitted for induction of labor due to gestational Hypertension.  Subjective: Pain 6 of 10, No headaches, nausea, vomiting. Decided she would like the epidural.  Objective: BP 135/88   Pulse 86   Temp 98.4 F (36.9 C) (Oral)   Resp 18   Ht 5\' 6"  (1.676 m)   Wt 93.9 kg (207 lb)   LMP 07/29/2016 (Approximate)   BMI 33.41 kg/m  No intake/output data recorded. No intake/output data recorded.  FHT:  FHR: 145 bpm, variability: moderate,  accelerations:  Present,  decelerations:  Absent  UC:   regular, every 2-5 minutes SVE:   Dilation: 2 Effacement (%): 50 Station: Ballotable Exam by:: Tami HartmannNicole Murphy    Labs: Lab Results  Component Value Date   WBC 7.8 04/21/2017   HGB 12.8 04/21/2017   HCT 37.0 04/21/2017   MCV 78.4 04/21/2017   PLT 123 (L) 04/21/2017    Assessment / Plan: Induction of labor due to gHTN Not in labor   Labor: Progressing well continue Cytotec Fetal Wellbeing:  Category I Pain Control:  Epidural and IV pain meds I/D:  GBS positive- Penicillin prophylaxis  Anticipated MOD:  NSVD  Tami Murphy 04/21/2017, 5:50 PM  Tami AdaJazma Talya Quain, DO OB Fellow Center for [facility], [facility]

## 2017-04-21 NOTE — H&P (Addendum)
OBSTETRIC ADMISSION HISTORY AND PHYSICAL  Tami Murphy is a 22 y.o. female G1P0Chestine Murphy with IUP at 162w3d by 15wk US presenting for IOL for gestational HTN. Was seen in office today for ROB and had an elevated BP. Was sent to the MAU for further evaluation. States she has never had a high blood pressure before. Had a headache yesterday that resolved without medication. She reports +FMs, No LOF, no VB, no blurry vision, headaches or peripheral edema, and RUQ pain.  She plans on breast feeding. She decided to use Depo  for birth control.  Chlamydia + on 04/07/17 (no TOC). [facility]   Dating: By 15wk US--->  Estimated Date of Delivery: 05/02/17  Sono:    @[redacted]w[redacted]d , CWD, normal anatomy, Variable  presentation,  327g, 58% EFW  Prenatal History/Complications: Low risk pregnancy  Past Medical History: Past Medical History:  Diagnosis Date  . Pregnancy induced hypertension     Past Surgical History: Past Surgical History:  Procedure Laterality Date  . NO PAST SURGERIES      Obstetrical History: OB History    Gravida  1   Para      Term      Preterm      AB      Living        SAB      TAB      Ectopic      Multiple      Live Births              Social History: Social History   Socioeconomic History  . Marital status: Single    Spouse name: Not on file  . Number of children: Not on file  . Years of education: Not on file  . Highest education level: Not on file  Occupational History  . Not on file  Social Needs  . Financial resource strain: Not on file  . Food insecurity:    Worry: Not on file    Inability: Not on file  . Transportation needs:    Medical: Not on file    Non-medical: Not on file  Tobacco Use  . Smoking status: Never Smoker  . Smokeless tobacco: Never Used  Substance and Sexual Activity  . Alcohol use: No  . Drug use: No  . Sexual activity: Yes  Lifestyle  . Physical activity:    Days per week: Not on file     Minutes per session: Not on file  . Stress: Not on file  Relationships  . Social connections:    Talks on phone: Not on file    Gets together: Not on file    Attends religious service: Not on file    Active member of club or organization: Not on file    Attends meetings of clubs or organizations: Not on file    Relationship status: Not on file  Other Topics Concern  . Not on file  Social History Narrative  . Not on file    Family History: No family history on file.  Allergies: Allergies  Allergen Reactions  . Bactrim [Sulfamethoxazole-Trimethoprim] Hives    Medications Prior to Admission  Medication Sig Dispense Refill Last Dose  . albuterol (PROVENTIL HFA;VENTOLIN HFA) 108 (90 Base) MCG/ACT inhaler Inhale 2 puffs into the lungs every 6 (six) hours as needed for wheezing or shortness of breath. (Patient not taking: Reported on 12/10/2016) 1 Inhaler 0 Not Taking  . Prenatal Vit-Fe Fumarate-FA (PRENATAL VITAMIN PO) Take 1  tablet by mouth daily.    03/31/2017 at Unknown time     Review of Systems   All systems reviewed and negative except as stated in HPI  Blood pressure (!) 144/86, pulse 95, temperature 97.6 F (36.4 C), temperature source Oral, resp. rate 18, height 5\' 6"  (1.676 m), weight 93.9 kg (207 lb 1.9 oz), last menstrual period 07/29/2016. General appearance: alert and cooperative Lungs: clear to auscultation bilaterally Heart: regular rate and rhythm Abdomen: soft, non-tender; bowel sounds normal Pelvic:  Extremities: Homans sign is negative, no sign of DVT DTR's Normal  Presentation: unsure Fetal monitoringBaseline: 140 bpm, Variability: Good {> 6 bpm), Accelerations: Reactive and Decelerations: Absent Uterine activity: Infrequent mild contractions  Dilation: 2 Effacement (%): 50 Station: Ballotable Exam by:: Kris Hartmann    Prenatal labs: ABO, Rh: B/Positive/-- (11/01 0000) Antibody: Negative (11/01 0000) Rubella: Immune (11/01 0000) RPR: Nonreactive  (11/01 0000)  HBsAg: Negative (11/01 0000)  HIV: Non-reactive (11/01 0000)  GBS: Positive (04/01 0000)  1 hr Glucola abnormal 153, 3 hr normal Genetic screening  Negative  Anatomy US normal   Prenatal Transfer Tool  Maternal Diabetes: No Genetic Screening: Normal  Maternal Ultrasounds/Referrals: Normal Fetal Ultrasounds or other Referrals:  None Maternal Substance Abuse:  No Significant Maternal Medications:  None Significant Maternal Lab Results: Lab values include: Group B Strep positive  Results for orders placed or performed during the hospital encounter of 04/21/17 (from the past 24 hour(s))  Protein / creatinine ratio, urine   Collection Time: 04/21/17 10:53 AM  Result Value Ref Range   Creatinine, Urine 45.00 mg/dL   Total Protein, Urine 6 mg/dL   Protein Creatinine Ratio 0.13 0.00 - 0.15 mg/mg[Cre]  CBC   Collection Time: 04/21/17 11:39 AM  Result Value Ref Range   WBC 7.0 4.0 - 10.5 K/uL   RBC 4.35 3.87 - 5.11 MIL/uL   Hemoglobin 11.7 (L) 12.0 - 15.0 g/dL   HCT 40.9 (L) 81.1 - 91.4 %   MCV 78.6 78.0 - 100.0 fL   MCH 26.9 26.0 - 34.0 pg   MCHC 34.2 30.0 - 36.0 g/dL   RDW 78.2 95.6 - 21.3 %   Platelets 111 (L) 150 - 400 K/uL  Comprehensive metabolic panel   Collection Time: 04/21/17 11:39 AM  Result Value Ref Range   Sodium 140 135 - 145 mmol/L   Potassium 4.1 3.5 - 5.1 mmol/L   Chloride 110 101 - 111 mmol/L   CO2 22 22 - 32 mmol/L   Glucose, Bld 84 65 - 99 mg/dL   BUN 7 6 - 20 mg/dL   Creatinine, Ser 0.86 0.44 - 1.00 mg/dL   Calcium 9.0 8.9 - 57.8 mg/dL   Total Protein 7.0 6.5 - 8.1 g/dL   Albumin 2.9 (L) 3.5 - 5.0 g/dL   AST 22 15 - 41 U/L   ALT 18 14 - 54 U/L   Alkaline Phosphatase 211 (H) 38 - 126 U/L   Total Bilirubin 0.6 0.3 - 1.2 mg/dL   GFR calc non Af Amer >60 >60 mL/min   GFR calc Af Amer >60 >60 mL/min   Anion gap 8 5 - 15    There are no active problems to display for this patient.   Assessment/Plan:  Tami Murphy is a 22 y.o. G1P0 at  [redacted]w[redacted]d here for IOL due to gestational HTN. Pregnancy course has been uncomplicated.   #Labor: Induction of labor with cytotec.  #gHTN: No severe range pressures. PIH labs wnl. Asymptomatic. Monitor closely. #  Pain: IV pain meds prn; Epidural upon request #FWB: Category 1 #ID: GBS+ start PCN, Chlamydia + on 04/07/17 (no TOC) - recollect #MOF: Breastfeeding #MOC: Depo #Circ:  Outpatient   Beverly Sessions, Student-PA  04/21/2017, 1:15 PM  Caryl Ada, DO OB Fellow Center for [facility], [facility]

## 2017-04-21 NOTE — Progress Notes (Signed)
Subjective: Doing well, pain well controlled   Objective: BP (!) 144/95   Pulse 81   Temp 98.4 F (36.9 C) (Oral)   Resp 18   Ht 5\' 6"  (1.676 m)   Wt 93.9 kg (207 lb)   LMP 07/29/2016 (Approximate)   BMI 33.41 kg/m  No intake/output data recorded. No intake/output data recorded.  FHT:  FHR: 145 bpm, variability: moderate,  accelerations:  Abscent,  decelerations:  Absent UC:   regular, every 2-5 minutes SVE:   Dilation: 2 Effacement (%): 50 Station: -3 Exam by:: Dr. Doroteo GlassmanPhelps  Labs: Lab Results  Component Value Date   WBC 7.8 04/21/2017   HGB 12.8 04/21/2017   HCT 37.0 04/21/2017   MCV 78.4 04/21/2017   PLT 123 (L) 04/21/2017    Assessment / Plan: Induction of labor for gHTN. Doing well, little progress since last check. Cytotec and foley bulb in  Labor: cytotec, foley bulb Preeclampsia:  hypertension well contolled Fetal Wellbeing:  Category I Pain Control:  Epidural when appropriate I/D:  PCN for gbs ppx Anticipated MOD:  NSVD  Myrene BuddyJacob Betti Goodenow 04/21/2017, 8:09 PM

## 2017-04-21 NOTE — Anesthesia Procedure Notes (Signed)
Epidural Patient location during procedure: OB Start time: 04/21/2017 10:29 PM End time: 04/21/2017 10:33 PM  Staffing Anesthesiologist: Beryle LatheBrock, Thomas E, MD Performed: anesthesiologist   Preanesthetic Checklist Completed: patient identified, pre-op evaluation, timeout performed, IV checked, risks and benefits discussed and monitors and equipment checked  Epidural Patient position: sitting Prep: DuraPrep Patient monitoring: continuous pulse ox and blood pressure Approach: midline Location: L2-L3 Injection technique: LOR saline  Needle:  Needle type: Tuohy  Needle gauge: 17 G Needle length: 9 cm Needle insertion depth: 7 cm Catheter size: 19 Gauge Catheter at skin depth: 12 cm Test dose: negative and Other (1% lidocaine)  Additional Notes Patient identified. Risks including, but not limited to, bleeding, infection, nerve damage, paralysis, inadequate analgesia, blood pressure changes, nausea, vomiting, allergic reaction, postpartum back pain, itching, and headache were discussed. Patient expressed understanding and wished to proceed. Sterile prep and drape, including hand hygiene, mask, and sterile gloves were used. The patient was positioned and the spine was prepped. The skin was anesthetized with lidocaine. No paraesthesia or other complication noted. The patient did not experience any signs of intravascular injection such as tinnitus or metallic taste in mouth, nor signs of intrathecal spread such as rapid motor block. Please see nursing notes for vital signs. The patient tolerated the procedure well.   Leslye Peerhomas Brock, MDReason for block:procedure for pain

## 2017-04-21 NOTE — Anesthesia Pain Management Evaluation Note (Signed)
  CRNA Pain Management Visit Note  Patient: Tami Murphy, 22 y.o., female  "Hello I am a [facility]. We have an anesthesia team available at all times to provide care throughout the hospital, including epidural management and anesthesia for C-section. I don't know your plan for the delivery whether it a natural birth, water birth, IV sedation, nitrous supplementation, doula or epidural, but we want to meet your pain goals."   1.Was your pain managed to your expectations on prior hospitalizations?   No prior hospitalizations  2.What is your expectation for pain management during this hospitalization?     Epidural  3.How can we help you reach that goal? Epidural when desired  Record the patient's initial score and the patient's pain goal.   Pain: 0  Pain Goal: 5 [facility] wants you to be able to say your pain was always managed very well.  Tami Murphy 04/21/2017

## 2017-04-21 NOTE — Anesthesia Preprocedure Evaluation (Signed)
Anesthesia Evaluation  Patient identified by MRN, date of birth, ID band Patient awake    Reviewed: Allergy & Precautions, NPO status , Patient's Chart, lab work & pertinent test results  Airway Mallampati: II  TM Distance: >3 FB Neck ROM: Full    Dental   Pulmonary neg pulmonary ROS,    Pulmonary exam normal breath sounds clear to auscultation       Cardiovascular hypertension, Normal cardiovascular exam Rhythm:Regular Rate:Normal  PIH   Neuro/Psych negative neurological ROS  negative psych ROS   GI/Hepatic negative GI ROS, Neg liver ROS,   Endo/Other  Obesity  Renal/GU negative Renal ROS  negative genitourinary   Musculoskeletal negative musculoskeletal ROS (+)   Abdominal   Peds  Hematology negative hematology ROS (+)   Anesthesia Other Findings   Reproductive/Obstetrics (+) Pregnancy PIH                             Anesthesia Physical Anesthesia Plan  ASA: II  Anesthesia Plan: Epidural   Post-op Pain Management:    Induction:   PONV Risk Score and Plan:   Airway Management Planned: Natural Airway  Additional Equipment:   Intra-op Plan:   Post-operative Plan:   Informed Consent: I have reviewed the patients History and Physical, chart, labs and discussed the procedure including the risks, benefits and alternatives for the proposed anesthesia with the patient or authorized representative who has indicated his/her understanding and acceptance.     Plan Discussed with:   Anesthesia Plan Comments: (Labs reviewed. Platelets acceptable, patient not taking any blood thinning medications. Risks and benefits discussed with patient, patient expressed understanding and wished to proceed.)        Anesthesia Quick Evaluation

## 2017-04-21 NOTE — MAU Provider Note (Signed)
History     CSN: [id number]  Arrival date and time: 04/21/17 1049   First Provider Initiated Contact with Patient 04/21/17 1125      Chief Complaint  Patient presents with  . Hypertension   HPI  Tami Murphy is a 22 y.o. G1P0 at 4664w3d who [facility] for [facility]. Was seen in office today for ROB and had an elevated BP; appt was at 915. States she has never had a high blood pressure before. Had a headache yesterday that resolved without medication. Denies headache, visual disturbance, or epigastric pain today. Denies abdominal pain, vaginal bleeding, or LOF. Positive fetal movement. Denies complications with this pregnancy.   OB History    Gravida  1   Para      Term      Preterm      AB      Living        SAB      TAB      Ectopic      Multiple      Live Births              Past Medical History:  Diagnosis Date  . Pregnancy induced hypertension     Past Surgical History:  Procedure Laterality Date  . NO PAST SURGERIES      No family history on file.  Social History   Tobacco Use  . Smoking status: Never Smoker  . Smokeless tobacco: Never Used  Substance Use Topics  . Alcohol use: No  . Drug use: No    Allergies:  Allergies  Allergen Reactions  . Bactrim [Sulfamethoxazole-Trimethoprim] Hives    Medications Prior to Admission  Medication Sig Dispense Refill Last Dose  . albuterol (PROVENTIL HFA;VENTOLIN HFA) 108 (90 Base) MCG/ACT inhaler Inhale 2 puffs into the lungs every 6 (six) hours as needed for wheezing or shortness of breath. (Patient not taking: Reported on 12/10/2016) 1 Inhaler 0 Not Taking  . Prenatal Vit-Fe Fumarate-FA (PRENATAL VITAMIN PO) Take 1 tablet by mouth daily.    03/31/2017 at Unknown time    Review of Systems  Constitutional: Negative.   Eyes: Negative for photophobia.  Gastrointestinal: Negative.   Genitourinary: Negative.   Neurological: Positive for headaches (yesterday, none today).   Physical  Exam   Blood pressure (!) 143/93, pulse (!) 108, temperature 97.6 F (36.4 C), temperature source Oral, resp. rate 18, height 5\' 6"  (1.676 m), weight 207 lb 1.9 oz (93.9 kg), last menstrual period 07/29/2016. Patient Vitals for the past 24 hrs:  BP Temp Temp src Pulse Resp Height Weight  04/21/17 1215 (!) 151/89 - - (!) 101 - - -  04/21/17 1200 (!) 142/89 - - 100 - - -  04/21/17 1145 (!) 143/89 - - 96 - - -  04/21/17 1130 (!) 142/90 - - (!) 107 - - -  04/21/17 1121 139/90 - - (!) 107 - - -  04/21/17 1111 (!) 143/93 97.6 F (36.4 C) Oral (!) 108 18 - -  04/21/17 1100 - - - - - 5\' 6"  (1.676 m) 207 lb 1.9 oz (93.9 kg)    Physical Exam  Nursing note and vitals reviewed. Constitutional: She is oriented to person, place, and time. She appears well-developed and well-nourished. No distress.  HENT:  Head: Normocephalic and atraumatic.  Eyes: Conjunctivae are normal. Right eye exhibits no discharge. Left eye exhibits no discharge. No scleral icterus.  Neck: Normal range of motion.  Cardiovascular: Normal rate, regular rhythm and  normal heart sounds.  No murmur heard. Respiratory: Effort normal and breath sounds normal. No respiratory distress. She has no wheezes.  GI: Soft. There is no tenderness.  Neurological: She is alert and oriented to person, place, and time. She has normal reflexes.  No clonus  Skin: Skin is warm and dry. She is not diaphoretic.  Psychiatric: She has a normal mood and affect. Her behavior is normal. Judgment and thought content normal.    MAU Course  Procedures Results for orders placed or performed during the hospital encounter of 04/21/17 (from the past 24 hour(s))  Protein / creatinine ratio, urine     Status: None   Collection Time: 04/21/17 10:53 AM  Result Value Ref Range   Creatinine, Urine 45.00 mg/dL   Total Protein, Urine 6 mg/dL   Protein Creatinine Ratio 0.13 0.00 - 0.15 mg/mg[Cre]  CBC     Status: Abnormal   Collection Time: 04/21/17 11:39 AM   Result Value Ref Range   WBC 7.0 4.0 - 10.5 K/uL   RBC 4.35 3.87 - 5.11 MIL/uL   Hemoglobin 11.7 (L) 12.0 - 15.0 g/dL   HCT 16.1 (L) 09.6 - 04.5 %   MCV 78.6 78.0 - 100.0 fL   MCH 26.9 26.0 - 34.0 pg   MCHC 34.2 30.0 - 36.0 g/dL   RDW 40.9 81.1 - 91.4 %   Platelets 111 (L) 150 - 400 K/uL  Comprehensive metabolic panel     Status: Abnormal   Collection Time: 04/21/17 11:39 AM  Result Value Ref Range   Sodium 140 135 - 145 mmol/L   Potassium 4.1 3.5 - 5.1 mmol/L   Chloride 110 101 - 111 mmol/L   CO2 22 22 - 32 mmol/L   Glucose, Bld 84 65 - 99 mg/dL   BUN 7 6 - 20 mg/dL   Creatinine, Ser 7.82 0.44 - 1.00 mg/dL   Calcium 9.0 8.9 - 95.6 mg/dL   Total Protein 7.0 6.5 - 8.1 g/dL   Albumin 2.9 (L) 3.5 - 5.0 g/dL   AST 22 15 - 41 U/L   ALT 18 14 - 54 U/L   Alkaline Phosphatase 211 (H) 38 - 126 U/L   Total Bilirubin 0.6 0.3 - 1.2 mg/dL   GFR calc non Af Amer >60 >60 mL/min   GFR calc Af Amer >60 >60 mL/min   Anion gap 8 5 - 15    MDM NST:  Baseline: 140 bpm, Variability: Good {> 6 bpm), Accelerations: Reactive and Decelerations: Absent BPs consistently elevated, none severe range CBC, CMP, urine PCR C/w Dr. Adrian Blackwater regarding labs and BPs. Will admit to birthing suites for IOL d/t gestational hypertension.   Assessment and Plan  A: 1. Gestational hypertension, third trimester   2. [redacted] weeks gestation of pregnancy    P: Admit to birthing suites for IOL Pt turned over to Dr. Valarie Cones 04/21/2017, 11:25 AM

## 2017-04-22 ENCOUNTER — Encounter (HOSPITAL_COMMUNITY): Payer: Self-pay | Admitting: *Deleted

## 2017-04-22 DIAGNOSIS — Z3A38 38 weeks gestation of pregnancy: Secondary | ICD-10-CM

## 2017-04-22 DIAGNOSIS — O99824 Streptococcus B carrier state complicating childbirth: Secondary | ICD-10-CM

## 2017-04-22 DIAGNOSIS — O134 Gestational [pregnancy-induced] hypertension without significant proteinuria, complicating childbirth: Secondary | ICD-10-CM

## 2017-04-22 LAB — CBC
HCT: 32.8 % — ABNORMAL LOW (ref 36.0–46.0)
HEMOGLOBIN: 11.1 g/dL — AB (ref 12.0–15.0)
MCH: 26.6 pg (ref 26.0–34.0)
MCHC: 33.8 g/dL (ref 30.0–36.0)
MCV: 78.5 fL (ref 78.0–100.0)
Platelets: 100 10*3/uL — ABNORMAL LOW (ref 150–400)
RBC: 4.18 MIL/uL (ref 3.87–5.11)
RDW: 14.3 % (ref 11.5–15.5)
WBC: 13.2 10*3/uL — ABNORMAL HIGH (ref 4.0–10.5)

## 2017-04-22 LAB — RPR: RPR Ser Ql: NONREACTIVE

## 2017-04-22 LAB — GC/CHLAMYDIA PROBE AMP (~~LOC~~) NOT AT ARMC
Chlamydia: NEGATIVE
Neisseria Gonorrhea: NEGATIVE

## 2017-04-22 MED ORDER — TETANUS-DIPHTH-ACELL PERTUSSIS 5-2.5-18.5 LF-MCG/0.5 IM SUSP: 0.5000 mL | Freq: Once | INTRAMUSCULAR | Status: DC

## 2017-04-22 MED ORDER — ACETAMINOPHEN 325 MG PO TABS: 650.0000 mg | ORAL_TABLET | ORAL | Status: DC | PRN

## 2017-04-22 MED ORDER — ONDANSETRON HCL 4 MG PO TABS: 4.0000 mg | ORAL_TABLET | ORAL | Status: DC | PRN

## 2017-04-22 MED ORDER — BENZOCAINE-MENTHOL 20-0.5 % EX AERO: 1.0000 "application " | INHALATION_SPRAY | CUTANEOUS | Status: DC | PRN

## 2017-04-22 MED ORDER — ONDANSETRON HCL 4 MG/2ML IJ SOLN: 4.0000 mg | INTRAMUSCULAR | Status: DC | PRN

## 2017-04-22 MED ORDER — DIBUCAINE 1 % RE OINT: 1.0000 "application " | TOPICAL_OINTMENT | RECTAL | Status: DC | PRN

## 2017-04-22 MED ORDER — MISOPROSTOL 200 MCG PO TABS
ORAL_TABLET | ORAL | Status: AC
  Administered 2017-04-22: 800 ug via RECTAL
  Filled 2017-04-22: qty 4

## 2017-04-22 MED ORDER — SIMETHICONE 80 MG PO CHEW
80.0000 mg | CHEWABLE_TABLET | ORAL | Status: DC | PRN
Start: 2017-04-22 — End: 2017-04-24

## 2017-04-22 MED ORDER — ZOLPIDEM TARTRATE 5 MG PO TABS: 5.0000 mg | ORAL_TABLET | Freq: Every evening | ORAL | Status: DC | PRN

## 2017-04-22 MED ORDER — SENNOSIDES-DOCUSATE SODIUM 8.6-50 MG PO TABS
2.0000 | ORAL_TABLET | ORAL | Status: DC
  Administered 2017-04-22 – 2017-04-24 (×2): 2 via ORAL
  Filled 2017-04-22 (×2): qty 2

## 2017-04-22 MED ORDER — DIPHENHYDRAMINE HCL 25 MG PO CAPS: 25.0000 mg | ORAL_CAPSULE | Freq: Four times a day (QID) | ORAL | Status: DC | PRN

## 2017-04-22 MED ORDER — IBUPROFEN 600 MG PO TABS
600.0000 mg | ORAL_TABLET | Freq: Four times a day (QID) | ORAL | Status: DC
  Administered 2017-04-22 – 2017-04-24 (×7): 600 mg via ORAL
  Filled 2017-04-22 (×7): qty 1

## 2017-04-22 MED ORDER — COCONUT OIL OIL: 1.0000 "application " | TOPICAL_OIL | Status: DC | PRN

## 2017-04-22 MED ORDER — PRENATAL MULTIVITAMIN CH
1.0000 | ORAL_TABLET | Freq: Every day | ORAL | Status: DC
  Administered 2017-04-23 – 2017-04-24 (×2): 1 via ORAL
  Filled 2017-04-22 (×2): qty 1

## 2017-04-22 MED ORDER — OXYTOCIN 40 UNITS IN LACTATED RINGERS INFUSION - SIMPLE MED
1.0000 m[IU]/min | INTRAVENOUS | Status: DC
  Administered 2017-04-22: 2 m[IU]/min via INTRAVENOUS

## 2017-04-22 MED ORDER — WITCH HAZEL-GLYCERIN EX PADS: 1.0000 "application " | MEDICATED_PAD | CUTANEOUS | Status: DC | PRN

## 2017-04-22 MED ORDER — TERBUTALINE SULFATE 1 MG/ML IJ SOLN
0.2500 mg | Freq: Once | INTRAMUSCULAR | Status: DC | PRN
  Filled 2017-04-22: qty 1

## 2017-04-22 NOTE — Progress Notes (Addendum)
Subjective: Pain well controlled with epidural   Objective: BP 126/89   Pulse 92   Temp 97.7 F (36.5 C) (Oral)   Resp 18   Ht 5\' 6"  (1.676 m)   Wt 93.9 kg (207 lb)   LMP 07/29/2016 (Approximate)   SpO2 100%   BMI 33.41 kg/m  No intake/output data recorded. No intake/output data recorded.  FHT:  FHR: 145 bpm, variability: minimal ,  accelerations:  Abscent,  decelerations:  Absent UC:   regular, every 3-5 minutes SVE:   Dilation: 4.5 Effacement (%): 70 Station: -3 Exam by:: Cletis MediaK. Anderson, RN  Labs: Lab Results  Component Value Date   WBC 7.8 04/21/2017   HGB 12.8 04/21/2017   HCT 37.0 04/21/2017   MCV 78.4 04/21/2017   PLT 123 (L) 04/21/2017    Assessment / Plan: Induction of labor for gHTN. Doing well, some progress since last check. Now 4.5 with foley bulb out. Receiving pit at 2x2  Labor: Progressing on Pitocin, will continue to increase then AROM gHTN:  blood pressure stable in 120s systolic Fetal Wellbeing:  Category I Pain Control:  Epidural I/D:  n/a Anticipated MOD:  NSVD  Myrene BuddyJacob Godfrey Tritschler 04/22/2017, 2:05 AM

## 2017-04-22 NOTE — Progress Notes (Signed)
Labor Progress Note  Tami Murphy is a 22 y.o. G1P0 at 1118w4d admitted for induction of labor due to gHTN.  S: Comfortable with epidural. No complaints. Denies PIH symptoms.   O:  BP (!) 136/92   Pulse 93   Temp 98 F (36.7 C) (Oral)   Resp 19   Ht 5\' 6"  (1.676 m)   Wt 93.9 kg (207 lb)   LMP 07/29/2016 (Approximate)   SpO2 100%   BMI 33.41 kg/m   No intake/output data recorded.  FHT:  FHR: 140 bpm, variability: moderate,  accelerations:  Present,  decelerations:  Absent UC:   regular, every 2-4 minutes SVE:   Dilation: 5.5 Effacement (%): 80 Station: -2 Exam by:: Dr. Doroteo GlassmanPhelps  Pitocin @ 4 mu/min  Labs: Lab Results  Component Value Date   WBC 7.8 04/21/2017   HGB 12.8 04/21/2017   HCT 37.0 04/21/2017   MCV 78.4 04/21/2017   PLT 123 (L) 04/21/2017    Assessment / Plan: 22 y.o. G1P0 2518w4d in early labor Induction of labor due to gestational hypertension,  progressing well on pitocin  Labor: Progressing on Pitocin, will continue to increase then AROM  GHTN: BPs minimally elevated no severe range pressures Fetal Wellbeing:  Category I Pain Control:  Epidural Anticipated MOD:  NSVD  Expectant management   Caryl AdaJazma Phelps, DO OB Fellow [facility]

## 2017-04-22 NOTE — Progress Notes (Signed)
Caelan Melaragno Delia Chestine SporeClark is a 22 y.o. G1P0000 at 6069w4d admitted for IOL for gHTN.  Subjective: Pt having vaginal pain/pressure with epidural in place. Family in room for support.   Objective: BP (!) 142/90   Pulse 96   Temp (!) 97.4 F (36.3 C) (Oral)   Resp 18   Ht 5\' 6"  (1.676 m)   Wt 93.9 kg (207 lb)   LMP 07/29/2016 (Approximate)   SpO2 100%   BMI 33.41 kg/m  No intake/output data recorded. Total I/O In: 3565.3 [P.O.:180; I.V.:2898.4; Other:186.9; IV Piggyback:300] Out: -   FHT:  FHR: 138 bpm, variability: moderate,  accelerations:  Present,  decelerations:  Absent UC:   Irregular coupling occurring around every 4 mins  SVE:   Dilation: 7.5 Effacement (%): 80 Station: -2, -1 Exam by:: Enis SlipperJane Bailey, RN The AROM was successful with clear fluid present post procedure.   Labs: Lab Results  Component Value Date   WBC 7.8 04/21/2017   HGB 12.8 04/21/2017   HCT 37.0 04/21/2017   MCV 78.4 04/21/2017   PLT 123 (L) 04/21/2017    Assessment / Plan: Induction of labor due to gestational hypertension S/P Cytotec  Labor: Progressing on Pitocin, will continue to increase then AROM.  AROM was successful, will continue to increase Pitocin PRN and recheck in 2 hours. Consider placing IUPC.  Preeclampsia:  labs stable Fetal Wellbeing:  Category I Pain Control:  Epidural I/D:  GBS positive on PCN Anticipated MOD:  NSVD  Beverly SessionsGrant G Bassheva Flury 04/22/2017, 1:28 PM

## 2017-04-22 NOTE — Progress Notes (Signed)
Tami Murphy is a 22 y.o. G1P0000 at 3359w4d admitted for IOL for gHTN.  Subjective: Pt comfortable with epidural. Family in room for support.   Objective: BP 131/84   Pulse 81   Temp (!) 97.4 F (36.3 C) (Oral)   Resp 18   Ht 5\' 6"  (1.676 m)   Wt 207 lb (93.9 kg)   LMP 07/29/2016 (Approximate)   SpO2 100%   BMI 33.41 kg/m  No intake/output data recorded. No intake/output data recorded.  FHT:  FHR: 135 bpm, variability: moderate,  accelerations:  Present,  decelerations:  Absent UC:   Irregular, not tracing well on toco SVE:   Dilation: 6 Effacement (%): 60 Station: -2 Exam by:: Enis SlipperJane Bailey, RN Attempt at AROM, no fluid return but palpable tear of at least one layer of membranes.    Labs: Lab Results  Component Value Date   WBC 7.8 04/21/2017   HGB 12.8 04/21/2017   HCT 37.0 04/21/2017   MCV 78.4 04/21/2017   PLT 123 (L) 04/21/2017    Assessment / Plan: Induction of labor due to gestational hypertension S/P Cytotec  Labor: Progressing on Pitocin, will continue to increase then AROM.  AROM unsuccessful, will continue to increase Pitocin PRN and recheck in 2 hours. Consider placing IUPC.   Preeclampsia:  labs stable Fetal Wellbeing:  Category I Pain Control:  Epidural I/D:  GBS positive on PCN Anticipated MOD:  NSVD  Sharen CounterLisa Leftwich-Kirby 04/22/2017, 11:20 AM

## 2017-04-22 NOTE — Progress Notes (Signed)
Tami Murphy Tami Murphy is a 22 y.o. G1P0000 at 6762w4d admitted for IOL for gHTN.  Subjective: [name] is continuing to have pain/pressure with the epidural in place with each contraction. Progressing well. Family in room for support.   Objective: BP 137/89   Pulse 88   Temp 97.8 F (36.6 C) (Oral)   Resp 18   Ht 5\' 6"  (1.676 m)   Wt 93.9 kg (207 lb)   LMP 07/29/2016 (Approximate)   SpO2 100%   BMI 33.41 kg/m  No intake/output data recorded. Total I/O In: 3565.3 [P.O.:180; I.V.:2898.4; Other:186.9; IV Piggyback:300] Out: -   FHT:  FHR: 145 bpm, variability: moderate,  accelerations:  Present,  decelerations:  Absent UC:   Regular occurring every 2 mins  SVE:   Dilation: 9 Effacement (%): 100 Station: 0 Exam by:: Enis SlipperJane Bailey, RN The AROM was successful with clear fluid present post procedure.   Labs: Lab Results  Component Value Date   WBC 7.8 04/21/2017   HGB 12.8 04/21/2017   HCT 37.0 04/21/2017   MCV 78.4 04/21/2017   PLT 123 (L) 04/21/2017    Assessment / Plan: Induction of labor due to gestational hypertension S/P Cytotec  Labor: Progressing on Pitocin. AROM was successful, will continue to increase Pitocin PRN and recheck in 2 hours. Preeclampsia:  labs stable Fetal Wellbeing:  Category I Pain Control:  Epidural I/D:  GBS positive on PCN Anticipated MOD:  NSVD  Beverly SessionsGrant G Blandon Offerdahl 04/22/2017, 4:11 PM

## 2017-04-23 MED ORDER — LIDOCAINE HCL (PF) 1 % IJ SOLN
INTRAMUSCULAR | Status: AC
  Filled 2017-04-23: qty 30

## 2017-04-23 MED ORDER — NIFEDIPINE ER OSMOTIC RELEASE 30 MG PO TB24
30.0000 mg | ORAL_TABLET | Freq: Every day | ORAL | Status: DC
  Administered 2017-04-23: 30 mg via ORAL
  Filled 2017-04-23: qty 1

## 2017-04-23 MED ORDER — OXYTOCIN 40 UNITS IN LACTATED RINGERS INFUSION - SIMPLE MED
INTRAVENOUS | Status: AC
  Filled 2017-04-23: qty 1000

## 2017-04-23 NOTE — Lactation Note (Signed)
This note was copied from a baby's chart. Lactation Consultation Note  Patient Name: Tami Murphy Reason for consult: Initial assessment;Primapara;1st time breastfeeding;Term  G1P1 mother whose infant is now 5223 hours old.  LC noted that the infant was having long breastfeeding sessions, has had no stools yet and one void.  When talking with mother she states that the latch feels good and there is no pain.  She is hearing the infant swallowing at times.  She has not been taught hand expression yet and there are many visitors present.  This will need to be completed after visitors leave.  Reviewed STS, feeding infant 8-12 times/24 hours or earlier if he shows feeding cues, breast massage and how to maintain a deep latch at breast.  Reminded her to call if the latch is painful and an RN/LC can assess/assist with latch.    [facility] and they have seen her today.  Mom made aware of O/P services, breastfeeding support groups, community resources, and our phone # for post-discharge questions.   Maternal Data Formula Feeding for Exclusion: No Has patient been taught Hand Expression?: No(Many visitors present;mom will need to be taught later) Does the patient have breastfeeding experience prior to this delivery?: No  Feeding Feeding Type: Breast Fed Length of feed: 15 min(still feeding)  LATCH Score Latch: Grasps breast easily, tongue down, lips flanged, rhythmical sucking.(infant was latched when LC entered;mom verbalized latch assessment)  Audible Swallowing: A few with stimulation  Type of Nipple: Everted at rest and after stimulation  Comfort (Breast/Nipple): Soft / non-tender  Hold (Positioning): No assistance needed to correctly position infant at breast.  LATCH Score: 9  Interventions Interventions: Breast feeding basics reviewed;Skin to skin;Breast massage  Lactation Tools Discussed/Used WIC Program: Yes   Consult  Status Consult Status: Follow-up Date: 04/24/17 Follow-up type: In-patient    [name], 6:50 PM

## 2017-04-23 NOTE — Progress Notes (Addendum)
Post Partum Day 1 Subjective: [name] is a 22 yo here for SVD after IOL for gHTN. She is Rh positive, GBS positive with adequate prophylaxis. no complaints, up ad lib, tolerating PO and + flatus  Objective: Blood pressure 139/87, pulse 90, temperature 99.2 F (37.3 C), temperature source Oral, resp. rate 17, height 5\' 6"  (1.676 m), weight 93.9 kg (207 lb), last menstrual period 07/29/2016, SpO2 99 %, currently breastfeeding.  Physical Exam:  General: alert and cooperative Lochia: appropriate Uterine Fundus: firm Incision: N/A DVT Evaluation: No evidence of DVT seen on physical exam.  Recent Labs    04/21/17 1336 04/22/17 1940  HGB 12.8 11.1*  HCT 37.0 32.8*    Assessment/Plan: PP HTN- Start Procardia XL 30mg  Plan for discharge tomorrow, Breastfeeding and Contraception Depo   LOS: 2 days   Beverly SessionsGrant G Davidson 04/23/2017, 7:52 AM   Midwife attestation Post Partum Day 1 I have seen and examined this patient and agree with above documentation in the PA student's note.   Tami Murphy is a 22 y.o. G1P1001 s/p NSVD.  Pt denies problems with ambulating, voiding or po intake. Pain is well controlled.  Plan for birth control is Depo-Provera.  Method of Feeding: breast  PE:  BP 135/85 (BP Location: Right Arm)   Pulse 87   Temp 98.1 F (36.7 C) (Oral)   Resp 18   Ht 5\' 6"  (1.676 m)   Wt 207 lb (93.9 kg)   LMP 07/29/2016 (Approximate)   SpO2 99%   Breastfeeding? Unknown   BMI 33.41 kg/m  Gen: well appearing Heart: reg rate Lungs: normal WOB Fundus firm Ext: soft, no pain, no edema  Plan for discharge: tomorrow  Sharen CounterLisa Leftwich-Kirby, CNM 10:10 AM

## 2017-04-23 NOTE — Anesthesia Postprocedure Evaluation (Signed)
Anesthesia Post Note  Patient: Tami Murphy  Procedure(s) Performed: AN AD HOC LABOR EPIDURAL     Patient location during evaluation: Mother Baby Anesthesia Type: Epidural Level of consciousness: awake Pain management: pain level controlled Vital Signs Assessment: post-procedure vital signs reviewed and stable Respiratory status: spontaneous breathing Cardiovascular status: stable Postop Assessment: epidural receding, patient able to bend at knees and no headache Anesthetic complications: no    Last Vitals:  Vitals:   04/23/17 0301 04/23/17 0522  BP: (!) 149/78 139/87  Pulse: 86 90  Resp: 17   Temp:    SpO2:      Last Pain:  Vitals:   04/23/17 0700  TempSrc:   PainSc: 0-No pain   Pain Goal: Patients Stated Pain Goal: 5 (04/22/17 0720)               Edison PaceWILKERSON,Hubbard Seldon

## 2017-04-24 MED ORDER — IBUPROFEN 600 MG PO TABS: 600.0000 mg | ORAL_TABLET | Freq: Four times a day (QID) | ORAL | 0 refills | Status: DC

## 2017-04-24 NOTE — Lactation Note (Signed)
This note was copied from a baby's chart. Lactation Consultation Note. Baby showing feeding cues as I went into room. Dad offering pacifier. Encouraged to put baby to the breast. No stool since birth. Mom easily able to hand express Colostrum. Baby latched well and nursed for 10 min then off to sleep. Reviewed engorgement prevention and treatment. Does not have pump for home- [facility] Manual pump given with instructions for use and cleaning of pump pieces. Reviewed our phone number, OP appointments and BFSG as resources for support after DC. No questions at present. To call prn  Patient Name: Boy Franco Colletlexus Maertens EAVWU'JToday's Date: 04/24/2017 Reason for consult: Follow-up assessment   Maternal Data Formula Feeding for Exclusion: No Has patient been taught Hand Expression?: Yes Does the patient have breastfeeding experience prior to this delivery?: No  Feeding Feeding Type: Breast Fed Length of feed: 10 min  LATCH Score Latch: Grasps breast easily, tongue down, lips flanged, rhythmical sucking.  Audible Swallowing: A few with stimulation  Type of Nipple: Everted at rest and after stimulation  Comfort (Breast/Nipple): Soft / non-tender  Hold (Positioning): Assistance needed to correctly position infant at breast and maintain latch.  LATCH Score: 8  Interventions Interventions: Breast feeding basics reviewed;Assisted with latch;Hand express;Breast compression  Lactation Tools Discussed/Used WIC Program: Yes Pump Review: Setup, frequency, and cleaning   Consult Status Consult Status: Follow-up Date: 04/25/17 Follow-up type: In-patient    Pamelia HoitWeeks, Caidyn Blossom D 04/24/2017, 9:58 AM

## 2017-04-24 NOTE — Discharge Instructions (Signed)

## 2017-04-24 NOTE — Discharge Summary (Signed)
OB Discharge Summary     Patient Name: Tami Murphy DOB: Dec 05, 1995 MRN: [id number]  Date of admission: 04/21/2017 Delivering MD: Sharen CounterLEFTWICH-KIRBY, LISA A   Date of discharge: 04/24/2017  Admitting diagnosis: 38wks nst lab high bp Intrauterine pregnancy: 2011w4d     Secondary diagnosis:  Active Problems:   Gestational hypertension   NSVD (normal spontaneous vaginal delivery)  Additional problems: None     Discharge diagnosis: Term Pregnancy Delivered and Gestational Hypertension                                                                                                Post partum procedures:none  Augmentation: AROM, Pitocin, Cytotec and Foley Balloon  Complications: None  Hospital course:  Induction of Labor With Vaginal Delivery   22 y.o. yo G1P1001 at 6311w4d was admitted to the hospital 04/21/2017 for induction of labor.  Indication for induction: Gestational hypertension.  Patient had an uncomplicated labor course as follows: Membrane Rupture Time/Date: 11:11 AM ,04/22/2017   Intrapartum Procedures: Episiotomy: None [1]                                         Lacerations:  None [1]  Patient had delivery of a Viable infant.  Information for the patient's newborn:  Angie FavaClark, Boy Cheyeanne [[id number]][[id number]]  Delivery Method: Vag-Spont   04/22/2017  Details of delivery can be found in separate delivery note.  Patient had a routine postpartum course. BPs wnl postpartum. Patient is discharged home 04/24/17.  Physical exam  Vitals:   04/23/17 0940 04/23/17 1100 04/23/17 1700 04/24/17 0519  BP: 135/85 128/82 123/78 119/84  Pulse: 87 83 87 82  Resp: 18 20 20 18   Temp: 98.1 F (36.7 C)  98 F (36.7 C) 97.7 F (36.5 C)  TempSrc: Oral  Oral Oral  SpO2:      Weight:      Height:       General: alert, cooperative and no distress Lochia: appropriate Uterine Fundus: firm Incision: N/A DVT Evaluation: No evidence of DVT seen on physical exam. No cords or calf tenderness. Labs: Lab  Results  Component Value Date   WBC 13.2 (H) 04/22/2017   HGB 11.1 (L) 04/22/2017   HCT 32.8 (L) 04/22/2017   MCV 78.5 04/22/2017   PLT 100 (L) 04/22/2017   CMP Latest Ref Rng & Units 04/21/2017  Glucose 65 - 99 mg/dL 84  BUN 6 - 20 mg/dL 7  Creatinine 9.520.44 - 8.411.00 mg/dL 3.240.79  Sodium 401135 - 027145 mmol/L 140  Potassium 3.5 - 5.1 mmol/L 4.1  Chloride 101 - 111 mmol/L 110  CO2 22 - 32 mmol/L 22  Calcium 8.9 - 10.3 mg/dL 9.0  Total Protein 6.5 - 8.1 g/dL 7.0  Total Bilirubin 0.3 - 1.2 mg/dL 0.6  Alkaline Phos 38 - 126 U/L 211(H)  AST 15 - 41 U/L 22  ALT 14 - 54 U/L 18    Discharge instruction: per After Visit Summary and "Baby and Me Booklet".  After visit  meds:  Allergies as of 04/24/2017      Reactions   Bactrim [sulfamethoxazole-trimethoprim] Hives      Medication List    TAKE these medications   ibuprofen 600 MG tablet Commonly known as:  ADVIL,MOTRIN Take 1 tablet (600 mg total) by mouth every 6 (six) hours.       Diet: routine diet  Activity: Advance as tolerated. Pelvic rest for 6 weeks.   Outpatient follow up:6 weeks Follow up Appt:No future appointments. Follow up Visit: Follow-up Information    Department, [facility]. Schedule an appointment as soon as possible for a visit.   Why:  For postpartum visit Contact information: [address] [place] [address] [phone]           Postpartum contraception: Depo Provera  Newborn Data: Live born female  Birth Weight: 7 lb 15 oz (3600 g) APGAR: 8, 9  Newborn Delivery   Birth date/time:  04/22/2017 19:08:00 Delivery type:  Vaginal, Spontaneous     Baby Feeding: Breast Disposition:home with mother   04/24/2017 Caryl Ada, DO

## 2017-04-24 NOTE — Lactation Note (Signed)
This note was copied from a baby's chart. Lactation Consultation Note Baby 30 hrs old. Had 2 voids and no stools. Mom has good flow of colostrum w/everted nipples. Mom states baby is cluster feeding. Currently sleeping in mom's arms.  Hand expression taught collected 2 ml colostrum in vial. Gave mom spoon to give to baby after BF. Encouraged to hand express more colostrum. Encouraged to occasionally massage breast during feedings.   Patient Name: Tami Murphy Reason for consult: Follow-up assessment;Early term 37-38.6wks   Maternal Data    Feeding Feeding Type: Breast Fed Length of feed: 12 min  LATCH Score          Comfort (Breast/Nipple): Soft / non-tender        Interventions Interventions: Breast feeding basics reviewed;Support pillows;Hand express;Expressed milk;Breast compression;Breast massage  Lactation Tools Discussed/Used     Consult Status Consult Status: Follow-up Date: 04/24/17 Follow-up type: In-patient    [name], Diamond NickelLAURA G Murphy, 1:13 AM

## 2017-06-15 ENCOUNTER — Encounter (HOSPITAL_COMMUNITY): Payer: Self-pay | Admitting: Emergency Medicine

## 2017-06-15 ENCOUNTER — Emergency Department (HOSPITAL_COMMUNITY): Payer: Medicaid Other

## 2017-06-15 ENCOUNTER — Emergency Department (HOSPITAL_COMMUNITY)
Admission: EM | Admit: 2017-06-15 | Discharge: 2017-06-15 | Disposition: A | Discharge status: Home / Self Care | Payer: Medicaid Other | Attending: Emergency Medicine | Admitting: Emergency Medicine

## 2017-06-15 DIAGNOSIS — R1011 Right upper quadrant pain: Secondary | ICD-10-CM | POA: Diagnosis present

## 2017-06-15 DIAGNOSIS — K8020 Calculus of gallbladder without cholecystitis without obstruction: Secondary | ICD-10-CM | POA: Diagnosis not present

## 2017-06-15 DIAGNOSIS — N938 Other specified abnormal uterine and vaginal bleeding: Secondary | ICD-10-CM | POA: Insufficient documentation

## 2017-06-15 DIAGNOSIS — Z79899 Other long term (current) drug therapy: Secondary | ICD-10-CM | POA: Diagnosis not present

## 2017-06-15 LAB — CBC
HEMATOCRIT: 40.2 % (ref 36.0–46.0)
Hemoglobin: 12.9 g/dL (ref 12.0–15.0)
MCH: 25.7 pg — ABNORMAL LOW (ref 26.0–34.0)
MCHC: 32.1 g/dL (ref 30.0–36.0)
MCV: 80.2 fL (ref 78.0–100.0)
PLATELETS: 217 10*3/uL (ref 150–400)
RBC: 5.01 MIL/uL (ref 3.87–5.11)
RDW: 13.4 % (ref 11.5–15.5)
WBC: 7 10*3/uL (ref 4.0–10.5)

## 2017-06-15 LAB — COMPREHENSIVE METABOLIC PANEL
ALBUMIN: 3.9 g/dL (ref 3.5–5.0)
ALT: 19 U/L (ref 14–54)
AST: 17 U/L (ref 15–41)
Alkaline Phosphatase: 65 U/L (ref 38–126)
Anion gap: 6 (ref 5–15)
BILIRUBIN TOTAL: 0.9 mg/dL (ref 0.3–1.2)
BUN: 12 mg/dL (ref 6–20)
CHLORIDE: 107 mmol/L (ref 101–111)
CO2: 25 mmol/L (ref 22–32)
CREATININE: 0.98 mg/dL (ref 0.44–1.00)
Calcium: 8.7 mg/dL — ABNORMAL LOW (ref 8.9–10.3)
GFR calc Af Amer: >60 mL/min (ref >60)
GLUCOSE: 95 mg/dL (ref 65–99)
POTASSIUM: 4 mmol/L (ref 3.5–5.1)
Sodium: 138 mmol/L (ref 135–145)
TOTAL PROTEIN: 7.2 g/dL (ref 6.5–8.1)

## 2017-06-15 LAB — I-STAT BETA HCG BLOOD, ED (MC, WL, AP ONLY): I-stat hCG, quantitative: <5 m[IU]/mL (ref <5)

## 2017-06-15 LAB — URINALYSIS, ROUTINE W REFLEX MICROSCOPIC
BILIRUBIN URINE: NEGATIVE
GLUCOSE, UA: NEGATIVE mg/dL
HGB URINE DIPSTICK: NEGATIVE
Ketones, ur: NEGATIVE mg/dL
Leukocytes, UA: NEGATIVE
Nitrite: NEGATIVE
PH: 5 (ref 5.0–8.0)
PROTEIN: NEGATIVE mg/dL
Specific Gravity, Urine: 1.028 (ref 1.005–1.030)

## 2017-06-15 LAB — LIPASE, BLOOD: LIPASE: 23 U/L (ref 11–51)

## 2017-06-15 MED ORDER — ONDANSETRON HCL 4 MG/2ML IJ SOLN
4.0000 mg | Freq: Once | INTRAMUSCULAR | Status: AC
  Administered 2017-06-15: 4 mg via INTRAVENOUS
  Filled 2017-06-15: qty 2

## 2017-06-15 MED ORDER — ONDANSETRON 4 MG PO TBDP: 4.0000 mg | ORAL_TABLET | Freq: Three times a day (TID) | ORAL | 0 refills | Status: DC | PRN

## 2017-06-15 MED ORDER — HYDROMORPHONE HCL 2 MG/ML IJ SOLN
1.0000 mg | Freq: Once | INTRAMUSCULAR | Status: AC
  Administered 2017-06-15: 1 mg via INTRAVENOUS
  Filled 2017-06-15: qty 1

## 2017-06-15 MED ORDER — SODIUM CHLORIDE 0.9 % IV BOLUS
1000.0000 mL | Freq: Once | INTRAVENOUS | Status: AC
  Administered 2017-06-15: 1000 mL via INTRAVENOUS

## 2017-06-15 NOTE — ED Notes (Signed)
Patient transported to Ultrasound 

## 2017-06-15 NOTE — Discharge Instructions (Addendum)
You have stones in your gallbladder.  Please follow-up with general surgery for further evaluation and management.  I have listed the information below.  In the meantime, it is important that you avoid greasy and fatty foods as this can worsen your symptoms.  I renew prescription for Zofran which is a medicine that you can take for nausea if you need it.  Return to the emergency department if you have any new or concerning symptoms like worsening abdominal pain, vomiting that will not stop despite medicine, fever greater than 100.4 F.

## 2017-06-15 NOTE — ED Triage Notes (Signed)
Pt states RUQ abdominal pain that comes and goes for several months. This episode started yesterday. Pt states 1 episode of vomiting and 1 of diarrhea.

## 2017-06-15 NOTE — ED Provider Notes (Signed)
[facility] EMERGENCY DEPARTMENT Provider Note   CSN: [id number] Arrival date & time: 06/15/17  1048     History   Chief Complaint Chief Complaint  Patient presents with  . Abdominal Pain    HPI Tami Murphy is a 22 y.o. female.  HPI   Tami Murphy is a 22 year old female with no significant past medical history who presents to the emergency department for evaluation of right upper quadrant pain.  Patient reports that she has had intermittent pain in this area for the past several months now.  She reports that her current episode of pain began around 10 PM last night.  Of note she had [employer] and [employer] yesterday.  She reports that pain is a 10/10 in severity and feels "like something is twisting my insides."  Pain does not radiate.  No known trigger to the pain.  She tried taking some ibuprofen last night which did not help with her symptoms.  She also reports feeling hot, no chills or measured temperature.  She has felt nauseous and has had 2 episodes of nonbloody emesis today.  She also had an episode of diarrhea last night.  She denies prior abdominal surgeries.  States that she is currently spotting, takes Depot and does not have regular periods. She denies dysuria, urinary frequency, vaginal discharge, chest pain, shortness of breath, lightheadedness, syncope.  Past Medical History:  Diagnosis Date  . Pregnancy induced hypertension     Patient Active Problem List   Diagnosis Date Noted  . NSVD (normal spontaneous vaginal delivery) 04/22/2017  . Gestational hypertension 04/21/2017    Past Surgical History:  Procedure Laterality Date  . NO PAST SURGERIES       OB History    Gravida  1   Para  1   Term  1   Preterm  0   AB  0   Living  1     SAB  0   TAB  0   Ectopic  0   Multiple  0   Live Births  1            Home Medications    Prior to Admission medications   Medication Sig Start Date End Date Taking? Authorizing Provider   medroxyPROGESTERone Acetate (DEPO-PROVERA IM) Inject 1 each into the muscle every 3 (three) months.   Yes [provider]  ibuprofen (ADVIL,MOTRIN) 600 MG tablet Take 1 tablet (600 mg total) by mouth every 6 (six) hours. Patient not taking: Reported on 06/15/2017 04/24/17   Pincus Large, DO    Family History No family history on file.  Social History Social History   Tobacco Use  . Smoking status: Never Smoker  . Smokeless tobacco: Never Used  Substance Use Topics  . Alcohol use: No  . Drug use: No     Allergies   Bactrim [sulfamethoxazole-trimethoprim]   Review of Systems Review of Systems  Constitutional: Positive for fever (tactile). Negative for chills.  Eyes: Negative for visual disturbance.  Respiratory: Negative for shortness of breath.   Cardiovascular: Negative for chest pain.  Gastrointestinal: Positive for abdominal pain (RUQ), diarrhea, nausea and vomiting. Negative for blood in stool.  Genitourinary: Positive for vaginal bleeding (spotting). Negative for difficulty urinating, dysuria, frequency, hematuria and vaginal discharge.  Musculoskeletal: Negative for back pain.  Skin: Negative for rash.  Neurological: Negative for syncope and light-headedness.  Psychiatric/Behavioral: Negative for agitation.      Physical Exam Updated Vital Signs BP 131/72 (BP Location:  Right Arm)   Pulse 81   Temp 97.9 F (36.6 C) (Oral)   Resp 14   Ht 5\' 5"  (1.651 m)   Wt 84.4 kg (186 lb)   LMP 07/29/2016 (Approximate)   SpO2 100%   BMI 30.95 kg/m   Physical Exam Constitutional: She is oriented to person, place, and time. She appears well-developed and well-nourished. No distress.  Sitting at bedside in no apparent distress, nontoxic-appearing.  HENT:  Head: Normocephalic and atraumatic.  Mouth/Throat: Oropharynx is clear and moist. No oropharyngeal exudate.  Mucous membranes moist.  Eyes: Pupils are equal, round, and reactive to light. Conjunctivae are  normal. Right eye exhibits no discharge. Left eye exhibits no discharge.  Neck: Normal range of motion. Neck supple.  Cardiovascular: Normal rate, regular rhythm and intact distal pulses.  No murmur heard. Pulmonary/Chest: Effort normal and breath sounds normal. No stridor. No respiratory distress. She has no wheezes. She has no rales.  Abdominal:  Abdomen soft and nondistended.  Acutely tender to palpation in the right upper quadrant as well as epigastrium with guarding.  No rebound tenderness.  Negative Murphy sign.  Negative McBurney's point.  No CVA tenderness.  Musculoskeletal: Normal range of motion.  Neurological: She is alert and oriented to person, place, and time. Coordination normal.  Skin: Skin is warm and dry. Capillary refill takes less than 2 seconds. She is not diaphoretic.  Psychiatric: She has a normal mood and affect. Her behavior is normal.  Nursing note and vitals reviewed.   ED Treatments / Results  Labs (all labs ordered are listed, but only abnormal results are displayed) Labs Reviewed  COMPREHENSIVE METABOLIC PANEL - Abnormal; Notable for the following components:      Result Value   Calcium 8.7 (*)    All other components within normal limits  CBC - Abnormal; Notable for the following components:   MCH 25.7 (*)    All other components within normal limits  LIPASE, BLOOD  URINALYSIS, ROUTINE W REFLEX MICROSCOPIC  I-STAT BETA HCG BLOOD, ED ([facility], [facility], [facility] ONLY)    EKG None  Radiology [place] Abdomen Limited Ruq  Result Date: 06/15/2017 CLINICAL DATA:  Right upper quadrant pain EXAM: ULTRASOUND ABDOMEN LIMITED RIGHT UPPER QUADRANT COMPARISON:  None. FINDINGS: Gallbladder: Within the gallbladder, there is a 7 mm echogenic focus which moves in shadows consistent with cholelithiasis. No gallbladder wall thickening or pericholecystic fluid evident. No sonographic Murphy sign noted by sonographer. Common bile duct: Diameter: 3 mm. No intrahepatic or extrahepatic biliary  duct dilatation. Liver: No focal lesion identified. Within normal limits in parenchymal echogenicity. Portal vein is patent on color Doppler imaging with normal direction of blood flow towards the liver. IMPRESSION: Cholelithiasis.  Study otherwise unremarkable. Electronically Signed   By: Bretta BangWilliam  Woodruff III M.D.   On: 06/15/2017 15:36    Procedures Procedures (including critical care time)  Medications Ordered in ED Medications  ondansetron (ZOFRAN) injection 4 mg (4 mg Intravenous Given 06/15/17 1329)  HYDROmorphone (DILAUDID) injection 1 mg (1 mg Intravenous Given 06/15/17 1329)  sodium chloride 0.9 % bolus 1,000 mL (0 mLs Intravenous Stopped 06/15/17 1454)     Initial Impression / Assessment and Plan / ED Course  I have reviewed the triage vital signs and the nursing notes.  Pertinent labs & imaging results that were available during my care of the patient were reviewed by me and considered in my medical decision making (see chart for details).     Right upper quadrant ultrasound shows cholelithiasis.  This is consistent with patient's symptoms.  Pain managed in the emergency department and patient able to tolerate p.o. fluids at the bedside.  Lab work largely unremarkable.  CBC without leukocytosis, hemoglobin within normal limits.  CMP without any major lecture light abnormalities, kidney function and liver enzymes within normal.  I-STAT beta hCG negative.  Lipase negative.  UA without signs of infection.  Repeat abdominal exam soft and tenderness improved.  No guarding, rigidity or peritoneal signs.  Have instructed patient on importance of avoiding fatty and greasy foods.  Will give her information to follow-up with general surgery in her discharge paperwork.  Will also discharge with Zofran as needed for nausea.  Counseled her on reasons to return to the emergency department and she agrees and appears reliable for follow-up.  Final Clinical Impressions(s) / ED Diagnoses   Final  diagnoses:  RUQ abdominal pain    ED Discharge Orders    None       Lawrence Marseilles 06/15/17 1605    Doug Sou, MD 06/15/17 [phone]

## 2017-06-15 NOTE — ED Notes (Signed)
Pt stable, ambulatory, and verbalizes understanding of d/c instructions.  

## 2017-06-18 ENCOUNTER — Inpatient Hospital Stay
Admission: EM | Admit: 2017-06-18 | Discharge: 2017-06-21 | DRG: 418 | Disposition: A | Discharge status: Home / Self Care | Payer: Medicaid Other | Attending: Surgery | Admitting: Surgery

## 2017-06-18 ENCOUNTER — Other Ambulatory Visit: Payer: Self-pay

## 2017-06-18 ENCOUNTER — Encounter: Payer: Self-pay | Admitting: Emergency Medicine

## 2017-06-18 ENCOUNTER — Emergency Department: Payer: Medicaid Other

## 2017-06-18 ENCOUNTER — Inpatient Hospital Stay: Payer: Medicaid Other

## 2017-06-18 ENCOUNTER — Emergency Department (HOSPITAL_COMMUNITY)
Admission: EM | Admit: 2017-06-18 | Discharge: 2017-06-18 | Discharge status: Against Medical Advice | Payer: Medicaid Other

## 2017-06-18 DIAGNOSIS — K8020 Calculus of gallbladder without cholecystitis without obstruction: Secondary | ICD-10-CM

## 2017-06-18 DIAGNOSIS — K851 Biliary acute pancreatitis without necrosis or infection: Secondary | ICD-10-CM | POA: Diagnosis present

## 2017-06-18 DIAGNOSIS — K660 Peritoneal adhesions (postprocedural) (postinfection): Secondary | ICD-10-CM | POA: Diagnosis present

## 2017-06-18 DIAGNOSIS — Z419 Encounter for procedure for purposes other than remedying health state, unspecified: Secondary | ICD-10-CM

## 2017-06-18 DIAGNOSIS — K819 Cholecystitis, unspecified: Secondary | ICD-10-CM | POA: Diagnosis present

## 2017-06-18 DIAGNOSIS — Z881 Allergy status to other antibiotic agents status: Secondary | ICD-10-CM

## 2017-06-18 DIAGNOSIS — R1010 Upper abdominal pain, unspecified: Secondary | ICD-10-CM

## 2017-06-18 DIAGNOSIS — K8062 Calculus of gallbladder and bile duct with acute cholecystitis without obstruction: Secondary | ICD-10-CM | POA: Diagnosis present

## 2017-06-18 LAB — COMPREHENSIVE METABOLIC PANEL
ALT: 398 U/L — AB (ref 14–54)
AST: 421 U/L — AB (ref 15–41)
Albumin: 3.9 g/dL (ref 3.5–5.0)
Alkaline Phosphatase: 182 U/L — ABNORMAL HIGH (ref 38–126)
Anion gap: 9 (ref 5–15)
BUN: 10 mg/dL (ref 6–20)
CHLORIDE: 103 mmol/L (ref 101–111)
CO2: 24 mmol/L (ref 22–32)
CREATININE: 0.88 mg/dL (ref 0.44–1.00)
Calcium: 9 mg/dL (ref 8.9–10.3)
GFR calc Af Amer: >60 mL/min (ref >60)
GFR calc non Af Amer: >60 mL/min (ref >60)
Glucose, Bld: 108 mg/dL — ABNORMAL HIGH (ref 65–99)
Potassium: 3.9 mmol/L (ref 3.5–5.1)
SODIUM: 136 mmol/L (ref 135–145)
Total Bilirubin: 4.4 mg/dL — ABNORMAL HIGH (ref 0.3–1.2)
Total Protein: 7.8 g/dL (ref 6.5–8.1)

## 2017-06-18 LAB — CBC
HEMATOCRIT: 38.7 % (ref 35.0–47.0)
Hemoglobin: 12.9 g/dL (ref 12.0–16.0)
MCH: 26.7 pg (ref 26.0–34.0)
MCHC: 33.4 g/dL (ref 32.0–36.0)
MCV: 79.8 fL — AB (ref 80.0–100.0)
PLATELETS: 193 10*3/uL (ref 150–440)
RBC: 4.85 MIL/uL (ref 3.80–5.20)
RDW: 13.9 % (ref 11.5–14.5)
WBC: 5.1 10*3/uL (ref 3.6–11.0)

## 2017-06-18 LAB — BILIRUBIN, DIRECT: Bilirubin, Direct: 1.7 mg/dL — ABNORMAL HIGH (ref 0.1–0.5)

## 2017-06-18 LAB — LIPASE, BLOOD: LIPASE: 733 U/L — AB (ref 11–51)

## 2017-06-18 MED ORDER — OXYCODONE HCL 5 MG PO TABS: 5.0000 mg | ORAL_TABLET | ORAL | Status: DC | PRN

## 2017-06-18 MED ORDER — PIPERACILLIN-TAZOBACTAM 3.375 G IVPB
3.3750 g | Freq: Three times a day (TID) | INTRAVENOUS | Status: DC
  Administered 2017-06-18 – 2017-06-20 (×7): 3.375 g via INTRAVENOUS
  Filled 2017-06-18 (×7): qty 50

## 2017-06-18 MED ORDER — ACETAMINOPHEN 650 MG RE SUPP: 650.0000 mg | Freq: Four times a day (QID) | RECTAL | Status: DC | PRN

## 2017-06-18 MED ORDER — ENOXAPARIN SODIUM 40 MG/0.4ML ~~LOC~~ SOLN
40.0000 mg | SUBCUTANEOUS | Status: DC
  Administered 2017-06-18 – 2017-06-19 (×2): 40 mg via SUBCUTANEOUS
  Filled 2017-06-18 (×2): qty 0.4

## 2017-06-18 MED ORDER — SODIUM CHLORIDE 0.9 % IV BOLUS
1000.0000 mL | Freq: Once | INTRAVENOUS | Status: AC
  Administered 2017-06-18: 1000 mL via INTRAVENOUS

## 2017-06-18 MED ORDER — MORPHINE SULFATE (PF) 2 MG/ML IV SOLN: 2.0000 mg | INTRAVENOUS | Status: DC | PRN

## 2017-06-18 MED ORDER — ONDANSETRON HCL 4 MG/2ML IJ SOLN
4.0000 mg | Freq: Once | INTRAMUSCULAR | Status: AC
  Administered 2017-06-18: 4 mg via INTRAVENOUS
  Filled 2017-06-18: qty 2

## 2017-06-18 MED ORDER — FAMOTIDINE IN NACL 20-0.9 MG/50ML-% IV SOLN
20.0000 mg | Freq: Once | INTRAVENOUS | Status: AC
  Administered 2017-06-18: 20 mg via INTRAVENOUS
  Filled 2017-06-18: qty 50

## 2017-06-18 MED ORDER — ONDANSETRON HCL 4 MG PO TABS: 4.0000 mg | ORAL_TABLET | Freq: Four times a day (QID) | ORAL | Status: DC | PRN

## 2017-06-18 MED ORDER — PIPERACILLIN-TAZOBACTAM 3.375 G IVPB 30 MIN
3.3750 g | Freq: Once | INTRAVENOUS | Status: AC
  Administered 2017-06-18: 3.375 g via INTRAVENOUS
  Filled 2017-06-18: qty 50

## 2017-06-18 MED ORDER — FENTANYL CITRATE (PF) 100 MCG/2ML IJ SOLN
50.0000 ug | Freq: Once | INTRAMUSCULAR | Status: AC
  Administered 2017-06-18: 50 ug via INTRAVENOUS
  Filled 2017-06-18: qty 2

## 2017-06-18 MED ORDER — SODIUM CHLORIDE 0.9 % IV SOLN
INTRAVENOUS | Status: DC | PRN
  Administered 2017-06-18: 09:00:00 via INTRAVENOUS

## 2017-06-18 MED ORDER — LACTATED RINGERS IV BOLUS
1000.0000 mL | Freq: Once | INTRAVENOUS | Status: AC
  Administered 2017-06-18: 1000 mL via INTRAVENOUS

## 2017-06-18 MED ORDER — GADOBENATE DIMEGLUMINE 529 MG/ML IV SOLN
17.0000 mL | Freq: Once | INTRAVENOUS | Status: AC | PRN
  Administered 2017-06-18: 17 mL via INTRAVENOUS

## 2017-06-18 MED ORDER — ACETAMINOPHEN 325 MG PO TABS
650.0000 mg | ORAL_TABLET | Freq: Four times a day (QID) | ORAL | Status: DC | PRN
  Administered 2017-06-19: 650 mg via ORAL
  Filled 2017-06-18: qty 2

## 2017-06-18 MED ORDER — ONDANSETRON HCL 4 MG/2ML IJ SOLN: 4.0000 mg | Freq: Four times a day (QID) | INTRAMUSCULAR | Status: DC | PRN

## 2017-06-18 MED ORDER — LACTATED RINGERS IV SOLN
INTRAVENOUS | Status: DC
  Administered 2017-06-18 – 2017-06-20 (×6): via INTRAVENOUS

## 2017-06-18 NOTE — Progress Notes (Signed)
Pharmacy Antibiotic Note  Tami Murphy is a 22 y.o. female admitted on 06/18/2017 with Intra abdominal infection.  Pharmacy has been consulted for Zosyn dosing.  Plan: Zosyn 3.375g IV q8h (4 hour infusion).  Height: 5\' 5"  (165.1 cm) Weight: 186 lb (84.4 kg) IBW/kg (Calculated) : 57  Temp (24hrs), Avg:98 F (36.7 C), Min:97.7 F (36.5 C), Max:98.3 F (36.8 C)  Recent Labs  Lab 06/15/17 1150 06/18/17 0419  WBC 7.0 5.1  CREATININE 0.98 0.88    Estimated Creatinine Clearance: 108.6 mL/min (by C-G formula based on SCr of 0.88 mg/dL).    Allergies  Allergen Reactions  . Bactrim [Sulfamethoxazole-Trimethoprim] Hives    Antimicrobials this admission: Zosyn 6/12 >>   Thank you for allowing pharmacy to be a part of this patient's care.  Clovia CuffLisa Phylicia Mcgaugh, PharmD, BCPS 06/18/2017 4:12 PM

## 2017-06-18 NOTE — H&P (Signed)
[facility]   PATIENT NAME: Tami Murphy    MR#:  [id number]  DATE OF BIRTH:  [birth year]  DATE OF ADMISSION:  06/18/2017  PRIMARY CARE PHYSICIAN: Patient, No Pcp Per   REQUESTING/REFERRING PHYSICIAN: Dr Chiquita Loth  CHIEF COMPLAINT:   Chief Complaint  Patient presents with  . Abdominal Pain    HISTORY OF PRESENT ILLNESS:  Tami Murphy  is a 22 y.o. female presents to the ER with abdominal pain.  She was seen on 06/15/2017 at [facility] and had an ultrasound of the abdomen at that time which showed gallstones.  She presents back to the ER today with abdominal pain going on for a while.  She states that it started a few months back when she was pregnant.  She delivered her baby 2 months ago.  Patient had some vomiting  2 days ago and some diarrhea on Saturday.  She was short of breath yesterday.  Abdominal pain is mostly in the right upper quadrant can be severe in intensity.  Comes and goes.  Nothing makes it better or worse.  Patient also having some headache and some dizziness.  Some weight loss.  In the ER she was found to have an elevated lipase and liver function test.  Ultrasound of the abdomen showed cholecystitis and slightly dilated ducts from previous ultrasound but not overly dilated.  Hospitalist services were contacted for admission.  ER physician spoke with general surgeon.  PAST MEDICAL HISTORY:   Past Medical History:  Diagnosis Date  . Pregnancy induced hypertension     PAST SURGICAL HISTORY:   Past Surgical History:  Procedure Laterality Date  . NO PAST SURGERIES      SOCIAL HISTORY:   Social History   Tobacco Use  . Smoking status: Never Smoker  . Smokeless tobacco: Never Used  Substance Use Topics  . Alcohol use: No    FAMILY HISTORY:   Family History  Problem Relation Age of Onset  . Healthy Mother   . Healthy Father   . CVA Maternal Grandfather     DRUG ALLERGIES:   Allergies  Allergen  Reactions  . Bactrim [Sulfamethoxazole-Trimethoprim] Hives    REVIEW OF SYSTEMS:  CONSTITUTIONAL: Positive for headache.  Positive for weight loss.  No fever, chills or sweats.  No fatigue or weakness.  EYES: No blurred or double vision.  Previous eye pain. EARS, NOSE, AND THROAT: No tinnitus or ear pain. No sore throat RESPIRATORY: No cough, positive for shortness of breath. no wheezing or hemoptysis.  CARDIOVASCULAR: Some chest pain, no orthopnea, edema.  GASTROINTESTINAL: Some nausea, vomiting, diarrhea and abdominal pain. No blood in bowel movements GENITOURINARY: No dysuria, hematuria.  ENDOCRINE: No polyuria, nocturia,  HEMATOLOGY: No anemia, easy bruising or bleeding SKIN: No rash or lesion. MUSCULOSKELETAL: No joint pain or arthritis.   NEUROLOGIC: No tingling, numbness, weakness.  PSYCHIATRY: No anxiety or depression.   MEDICATIONS AT HOME:   Prior to Admission medications   Medication Sig Start Date End Date Taking? Authorizing Provider  medroxyPROGESTERone Acetate (DEPO-PROVERA IM) Inject 1 each into the muscle every 3 (three) months.   Yes [provider]  ondansetron (ZOFRAN ODT) 4 MG disintegrating tablet Take 1 tablet (4 mg total) by mouth every 8 (eight) hours as needed for nausea or vomiting. 06/15/17   Kellie Shropshire, PA-C      VITAL SIGNS:  Blood pressure 114/80, pulse 64, temperature 98.3 F (36.8 C), temperature source Oral, resp. rate 16,  height 5\' 5"  (1.651 m), weight 84.4 kg (186 lb), last menstrual period 07/29/2016, SpO2 100 %, unknown if currently breastfeeding.  PHYSICAL EXAMINATION:  GENERAL:  22 y.o.-year-old patient lying in the bed with no acute distress.  EYES: Pupils equal, round, reactive to light and accommodation. No scleral icterus. Extraocular muscles intact.  HEENT: Head atraumatic, normocephalic. Oropharynx and nasopharynx clear.  NECK:  Supple, no jugular venous distention. No thyroid enlargement, no tenderness.  LUNGS: Normal  breath sounds bilaterally, no wheezing, rales,rhonchi or crepitation. No use of accessory muscles of respiration.  CARDIOVASCULAR: S1, S2 normal. No murmurs, rubs, or gallops.  ABDOMEN: Soft, right upper quadrant tenderness, nondistended. Bowel sounds present. No organomegaly or mass.  EXTREMITIES: No pedal edema, cyanosis, or clubbing.  NEUROLOGIC: Cranial nerves II through XII are intact. Muscle strength 5/5 in all extremities. Sensation intact. Gait not checked.  PSYCHIATRIC: The patient is alert and oriented x 3.  SKIN: No rash, lesion, or ulcer.   LABORATORY PANEL:   CBC Recent Labs  Lab 06/18/17 0419  WBC 5.1  HGB 12.9  HCT 38.7  PLT 193   ------------------------------------------------------------------------------------------------------------------  Chemistries  Recent Labs  Lab 06/18/17 0419  NA 136  K 3.9  CL 103  CO2 24  GLUCOSE 108*  BUN 10  CREATININE 0.88  CALCIUM 9.0  AST 421*  ALT 398*  ALKPHOS 182*  BILITOT 4.4*   ------------------------------------------------------------------------------------------------------------------    RADIOLOGY:  US Abdomen Limited Ruq  Result Date: 06/18/2017 CLINICAL DATA:  Increasing right upper quadrant pain radiating to the back. Known cholelithiasis. Now with elevated LFTs and lipase. EXAM: ULTRASOUND ABDOMEN LIMITED RIGHT UPPER QUADRANT COMPARISON:  Right upper quadrant ultrasound 3 days ago. FINDINGS: Gallbladder: Physiologically distended containing intraluminal gallstones and sludge. Gallbladder wall thickening and edema with wall thickness of 4 mm. Trace pericholecystic fluid. A positive sonographic Murphy sign noted by sonographer. Common bile duct: Diameter: 5 mm containing echogenic debris/sludge. No discrete choledocholithiasis. Liver: No focal lesion identified. Within normal limits in parenchymal echogenicity. Portal vein is patent on color Doppler imaging with normal direction of blood flow towards the  liver. IMPRESSION: 1. Known gallstones, but now with gallbladder wall thickening, pericholecystic fluid and positive sonographic Murphy sign consistent with acute cholecystitis. 2. Echogenic sludge/debris within the common bile duct which remains normal in caliber at 5 mm, but has increased from 3 mm on prior exam. No discrete choledocholithiasis. Electronically Signed   By: Rubye Oaks M.D.   On: 06/18/2017 06:46    EKG:   Normal sinus rhythm 74 bpm  IMPRESSION AND PLAN:   1.  Acute gallstone pancreatitis with acute cholecystitis, elevated liver function test and bilirubin.  N.p.o., IV fluids PRN pain and nausea medications.  MRCP of the abdomen to rule out a gallstone caught in the ducts.  Case discussed with gastroenterology and available for ERCP if needed.  ER physician spoke with surgeon.  Empiric antibiotics. 2.  Pregnancy-induced hypertension.  Blood pressure is normal.  With delivery of the baby there hypertension has resolved.   All the records are reviewed and case discussed with ED provider. Management plans discussed with the patient, family and they are in agreement.  CODE STATUS: Full code  TOTAL TIME TAKING CARE OF THIS PATIENT: 50 minutes in coordination of care and speaking with gastroenterology.   Alford Highland M.D on 06/18/2017 at 7:53 AM  Between 7am to 6pm - Pager - [phone]  After 6pm call admission pager [phone]  [facility]  [phone]  CC:  Primary care physician; Patient, No Pcp Per

## 2017-06-18 NOTE — ED Notes (Signed)
Patient transported to Ultrasound 

## 2017-06-18 NOTE — ED Notes (Signed)
Patient left before triage. 

## 2017-06-18 NOTE — ED Provider Notes (Signed)
[facility] Emergency Department Provider Note   ____________________________________________   First MD Initiated Contact with Patient 06/18/17 0413     (approximate)  I have reviewed the triage vital signs and the nursing notes.   HISTORY  Chief Complaint Abdominal Pain    HPI Tami Murphy is a 22 y.o. female who presents to the ED from home with a chief complaint of abdominal pain and nausea.  Patient was seen on 6/9 at [facility] ED for same.  Had an ultrasound which demonstrated cholelithiasis.  Was discharged home on Zofran only and encouraged to schedule surgical follow-up.  States she was told all the surgery appointments were full.  Has not eaten much for the past 3 to 4 days.  Endorses nausea without vomiting.  Pain increased tonight in her upper abdomen radiating to her right back.  Denies associated fever, chills, chest pain, shortness of breath, vomiting, diarrhea.  Denies recent travel or trauma.   Past Medical History:  Diagnosis Date  . Pregnancy induced hypertension     Patient Active Problem List   Diagnosis Date Noted  . Gallstone pancreatitis 06/18/2017  . NSVD (normal spontaneous vaginal delivery) 04/22/2017  . Gestational hypertension 04/21/2017    Past Surgical History:  Procedure Laterality Date  . NO PAST SURGERIES      Prior to Admission medications   Medication Sig Start Date End Date Taking? Authorizing Provider  medroxyPROGESTERone Acetate (DEPO-PROVERA IM) Inject 1 each into the muscle every 3 (three) months.   Yes [provider]  ibuprofen (ADVIL,MOTRIN) 600 MG tablet Take 1 tablet (600 mg total) by mouth every 6 (six) hours. Patient not taking: Reported on 06/15/2017 04/24/17   Pincus Large, DO  ondansetron (ZOFRAN ODT) 4 MG disintegrating tablet Take 1 tablet (4 mg total) by mouth every 8 (eight) hours as needed for nausea or vomiting. 06/15/17   Kellie Shropshire, PA-C    Allergies Bactrim  [sulfamethoxazole-trimethoprim]  No family history on file.  Social History Social History   Tobacco Use  . Smoking status: Never Smoker  . Smokeless tobacco: Never Used  Substance Use Topics  . Alcohol use: No  . Drug use: No    Review of Systems  Constitutional: No fever/chills Eyes: No visual changes. ENT: No sore throat. Cardiovascular: Denies chest pain. Respiratory: Denies shortness of breath. Gastrointestinal: Nominal pain and nausea, no vomiting.  No diarrhea.  No constipation. Genitourinary: Negative for dysuria. Musculoskeletal: Negative for back pain. Skin: Negative for rash. Neurological: Negative for headaches, focal weakness or numbness.   ____________________________________________   PHYSICAL EXAM:  VITAL SIGNS: ED Triage Vitals [06/18/17 0329]  Enc Vitals Group     BP 121/71     Pulse Rate 75     Resp 16     Temp 98.3 F (36.8 C)     Temp Source Oral     SpO2 100 %     Weight 186 lb (84.4 kg)     Height 5\' 5"  (1.651 m)     Head Circumference      Peak Flow      Pain Score 10     Pain Loc      Pain Edu?      Excl. in GC?     Constitutional: Alert and oriented. Well appearing and in mild acute distress. Eyes: Conjunctivae are normal. PERRL. EOMI. Head: Atraumatic. Nose: No congestion/rhinnorhea. Mouth/Throat: Mucous membranes are moist.  Oropharynx non-erythematous. Neck: No stridor.   Cardiovascular: Normal  rate, regular rhythm. Grossly normal heart sounds.  Good peripheral circulation. Respiratory: Normal respiratory effort.  No retractions. Lungs CTAB. Gastrointestinal: Soft and mildly tender to palpation epigastrium and right upper quadrant without rebound or guarding. No distention. No abdominal bruits. No CVA tenderness. Musculoskeletal: No lower extremity tenderness nor edema.  No joint effusions. Neurologic:  Normal speech and language. No gross focal neurologic deficits are appreciated. No gait instability. Skin:  Skin is warm,  dry and intact. No rash noted. Psychiatric: Mood and affect are normal. Speech and behavior are normal.  ____________________________________________   LABS (all labs ordered are listed, but only abnormal results are displayed)  Labs Reviewed  LIPASE, BLOOD - Abnormal; Notable for the following components:      Result Value   Lipase 733 (*)    All other components within normal limits  COMPREHENSIVE METABOLIC PANEL - Abnormal; Notable for the following components:   Glucose, Bld 108 (*)    AST 421 (*)    ALT 398 (*)    Alkaline Phosphatase 182 (*)    Total Bilirubin 4.4 (*)    All other components within normal limits  CBC - Abnormal; Notable for the following components:   MCV 79.8 (*)    All other components within normal limits   ____________________________________________  EKG  ED ECG REPORT I, Ky Moskowitz J, the attending physician, personally viewed and interpreted this ECG.   Date: 06/18/2017  EKG Time: 0334  Rate: 74  Rhythm: normal EKG, normal sinus rhythm  Axis: Normal  Intervals:none  ST&T Change: Nonspecific  ____________________________________________  RADIOLOGY  ED MD interpretation: Cholelithiasis with cholecystitis  Official radiology report(s): [place] Abdomen Limited Ruq  Result Date: 06/18/2017 CLINICAL DATA:  Increasing right upper quadrant pain radiating to the back. Known cholelithiasis. Now with elevated LFTs and lipase. EXAM: ULTRASOUND ABDOMEN LIMITED RIGHT UPPER QUADRANT COMPARISON:  Right upper quadrant ultrasound 3 days ago. FINDINGS: Gallbladder: Physiologically distended containing intraluminal gallstones and sludge. Gallbladder wall thickening and edema with wall thickness of 4 mm. Trace pericholecystic fluid. A positive sonographic Murphy sign noted by sonographer. Common bile duct: Diameter: 5 mm containing echogenic debris/sludge. No discrete choledocholithiasis. Liver: No focal lesion identified. Within normal limits in parenchymal  echogenicity. Portal vein is patent on color Doppler imaging with normal direction of blood flow towards the liver. IMPRESSION: 1. Known gallstones, but now with gallbladder wall thickening, pericholecystic fluid and positive sonographic Murphy sign consistent with acute cholecystitis. 2. Echogenic sludge/debris within the common bile duct which remains normal in caliber at 5 mm, but has increased from 3 mm on prior exam. No discrete choledocholithiasis. Electronically Signed   By: Rubye OaksMelanie  Ehinger M.D.   On: 06/18/2017 06:46    ____________________________________________   PROCEDURES  Procedure(s) performed: None  Procedures  Critical Care performed:   CRITICAL CARE Performed by: Irean HongSUNG,Billye Pickerel J   Total critical care time: 30 minutes  Critical care time was exclusive of separately billable procedures and treating other patients.  Critical care was necessary to treat or prevent imminent or life-threatening deterioration.  Critical care was time spent personally by me on the following activities: development of treatment plan with patient and/or surrogate as well as nursing, discussions with consultants, evaluation of patient's response to treatment, examination of patient, obtaining history from patient or surrogate, ordering and performing treatments and interventions, ordering and review of laboratory studies, ordering and review of radiographic studies, pulse oximetry and re-evaluation of patient's condition.  ____________________________________________   INITIAL IMPRESSION / ASSESSMENT AND PLAN /  ED COURSE  As part of my medical decision making, I reviewed the following data within the electronic MEDICAL RECORD NUMBER Nursing notes reviewed and incorporated, Labs reviewed, Old chart reviewed and Notes from prior ED visits   22 year old healthy female diagnosed with cholelithiasis who presents to the ED with increased pain and nausea. Differential diagnosis includes, but is not limited  to, biliary disease (biliary colic, acute cholecystitis, cholangitis, choledocholithiasis, etc), intrathoracic causes for epigastric abdominal pain including ACS, gastritis, duodenitis, pancreatitis, small bowel or large bowel obstruction, abdominal aortic aneurysm, hernia, and ulcer(s).  Patient is afebrile.  I personally reviewed patient's ED visit from 06/15/2017 and verified with both patient and from the records that she was discharged home with a prescription for Zofran only.  Will repeat lab work to evaluate change in LFTs and lipase.  Will administer IV fluids, IV fentanyl and Zofran for pain/nausea and IV Pepcid.   Clinical Course as of Jun 18 748  Wed Jun 18, 2017  0653 Patient resting in no acute distress.  Updated her of ultrasound results.  Will discuss with hospitalist to evaluate patient in the emergency department for admission.   [JS]  [phone] Spoke with Dr. Barton Dubois from general surgery who will evaluate patient in the hospital.   [JS]    Clinical Course User Index [JS] Irean Hong, MD     ____________________________________________   FINAL CLINICAL IMPRESSION(S) / ED DIAGNOSES  Final diagnoses:  Pain of upper abdomen  Gallstone pancreatitis  Cholecystitis     ED Discharge Orders    None       Note:  This document was prepared using Dragon voice recognition software and may include unintentional dictation errors.    Irean Hong, MD 06/18/17 [phone]

## 2017-06-18 NOTE — ED Notes (Addendum)
Report to Beth, RN

## 2017-06-18 NOTE — Consult Note (Signed)
Cephas Darby, MD [address]  [place]  [place], [place] [address]  Main: [phone]  Fax: [phone] Pager: [phone]   Consultation  Referring Provider:     No ref. provider found Primary Care Physician:  Patient, No Pcp Per Primary Gastroenterologist:  Dr. Sherri Sear         Reason for Consultation:     Acute cholecystitis, gallstone pancreatitis  Date of Admission:  06/18/2017 Date of Consultation:  06/18/2017         HPI:   Tami Murphy is a 22 y.o. African-American female history of pregnancy-induced hypertension, presents with 2 days history of acute episode of right upper quadrant pain radiating to back associated with nausea and nonbloody emesis. She reports that she had similar pain about 2 months ago when she was pregnant. She thought the pain was pregnancy related and did not seek any medical attention at that time.she came to ER on 06/15/2017, right upper quadrant ultrasound revealed cholelithiasis otherwise unremarkable.her LFTs were normal 3 days ago. She left from the ER and presented again early this morning due to worsening pain and was found to have acute cholecystitis and gallstone pancreatitis. Lipase was elevated, CBD size increased from 3 mm to 5 mm, LFTs suggestive of cholestatic picture. She is kept nothing by mouth, started on antibiotics, IV fluids. She feels significantly better when I saw her on the floor. She continues to have epigastric and right upper quadrant pain. Denies nausea or vomiting. She denies fever, chills. MRCP did not reveal intrahepatic or extrahepatic ductal dilation. No evidence of choledocholithiasis.   Past Medical History:  Diagnosis Date  . Pregnancy induced hypertension     Past Surgical History:  Procedure Laterality Date  . NO PAST SURGERIES      Prior to Admission medications   Medication Sig Start Date End Date Taking? Authorizing Provider  medroxyPROGESTERone Acetate (DEPO-PROVERA IM) Inject 1 each  into the muscle every 3 (three) months.   Yes [provider]  ondansetron (ZOFRAN ODT) 4 MG disintegrating tablet Take 1 tablet (4 mg total) by mouth every 8 (eight) hours as needed for nausea or vomiting. 06/15/17   Glyn Ade, PA-C    Family History  Problem Relation Age of Onset  . Healthy Mother   . Healthy Father   . CVA Maternal Grandfather      Social History   Tobacco Use  . Smoking status: Never Smoker  . Smokeless tobacco: Never Used  Substance Use Topics  . Alcohol use: No  . Drug use: No    Allergies as of 06/18/2017 - Review Complete 06/18/2017  Allergen Reaction Noted  . Bactrim [sulfamethoxazole-trimethoprim] Hives 06/21/2016    Review of Systems:    All systems reviewed and negative except where noted in HPI.   Physical Exam:  Vital signs in last 24 hours: Temp:  [97.7 F (36.5 C)-98.3 F (36.8 C)] 97.7 F (36.5 C) (06/12 0818) Pulse Rate:  [64-77] 73 (06/12 0818) Resp:  [16-18] 16 (06/12 0818) BP: (107-121)/(64-80) 107/73 (06/12 0818) SpO2:  [97 %-100 %] 100 % (06/12 0818) Weight:  [186 lb (84.4 kg)] 186 lb (84.4 kg) (06/12 0330) Last BM Date: 06/14/17 General:   Pleasant, cooperative in NAD Head:  Normocephalic and atraumatic. Eyes:   No icterus.   Conjunctiva pink. PERRLA.he Ears:  Normal auditory acuity. Neck:  Supple; no masses or thyroidomegaly Lungs: Respirations even and unlabored. Lungs clear to auscultation bilaterally.   No wheezes, crackles,  or rhonchi.  Heart:  Regular rate and rhythm;  Without murmur, clicks, rubs or gallops Abdomen:  Soft, nondistended, right upper quadrant and epigastric tenderness. Normal bowel sounds. No appreciable masses or hepatomegaly.  No rebound or guarding.  Rectal:  Not performed. Msk:  Symmetrical without gross deformities.  Strength normal  Extremities:  Without edema, cyanosis or clubbing. Neurologic:  Alert and oriented x3;  grossly normal neurologically. Skin:  Intact without  significant lesions or rashes. Cervical Nodes:  No significant cervical adenopathy. Psych:  Alert and cooperative. Normal affect.  LAB RESULTS: CBC Latest Ref Rng & Units 06/18/2017 06/15/2017 04/22/2017  WBC 3.6 - 11.0 K/uL 5.1 7.0 13.2(H)  Hemoglobin 12.0 - 16.0 g/dL 12.9 12.9 11.1(L)  Hematocrit 35.0 - 47.0 % 38.7 40.2 32.8(L)  Platelets 150 - 440 K/uL 193 217 100(L)    BMET BMP Latest Ref Rng & Units 06/18/2017 06/15/2017 04/21/2017  Glucose 65 - 99 mg/dL 108(H) 95 84  BUN 6 - 20 mg/dL '10 12 7  '$ Creatinine 0.44 - 1.00 mg/dL 0.88 0.98 0.79  Sodium 135 - 145 mmol/L 136 138 140  Potassium 3.5 - 5.1 mmol/L 3.9 4.0 4.1  Chloride 101 - 111 mmol/L 103 107 110  CO2 22 - 32 mmol/L '24 25 22  '$ Calcium 8.9 - 10.3 mg/dL 9.0 8.7(L) 9.0    LFT Hepatic Function Latest Ref Rng & Units 06/18/2017 06/15/2017 04/21/2017  Total Protein 6.5 - 8.1 g/dL 7.8 7.2 7.0  Albumin 3.5 - 5.0 g/dL 3.9 3.9 2.9(L)  AST 15 - 41 U/L 421(H) 17 22  ALT 14 - 54 U/L 398(H) 19 18  Alk Phosphatase 38 - 126 U/L 182(H) 65 211(H)  Total Bilirubin 0.3 - 1.2 mg/dL 4.4(H) 0.9 0.6  Bilirubin, Direct 0.1 - 0.5 mg/dL 1.7(H) - -     STUDIES: Mr 3d Recon At Scanner  Result Date: 06/18/2017 CLINICAL DATA:  Abdominal pain, vomiting, gallstones EXAM: MRI ABDOMEN WITHOUT AND WITH CONTRAST (INCLUDING MRCP) TECHNIQUE: Multiplanar multisequence MR imaging of the abdomen was performed both before and after the administration of intravenous contrast. Heavily T2-weighted images of the biliary and pancreatic ducts were obtained, and three-dimensional MRCP images were rendered by post processing. CONTRAST:  79m MULTIHANCE GADOBENATE DIMEGLUMINE 529 MG/ML IV SOLN COMPARISON:  Right upper quadrant ultrasound dated 06/18/2017 FINDINGS: Lower chest: Lung bases are clear. Hepatobiliary: Liver is within normal limits. Mild periportal edema. Small layering gallstones. No convincing gallbladder wall thickening. Mild pericholecystic fluid. No gallbladder  distention. Overall, this is considered equivocal, although mild acute cholecystitis is suspected. No intrahepatic or extrahepatic ductal dilatation. No choledocholithiasis is seen. Pancreas: Mild peripancreatic fluid along the pancreatic head/uncinate process, raising the possibility of mild acute pancreatitis, possibly secondary to the gallbladder findings. No peripancreatic fluid collection, atrophy, or ductal dilatation. Spleen:  Within normal limits. Adrenals/Urinary Tract:  Adrenal glands are within normal limits. Kidneys are within normal limits.  No hydronephrosis. Stomach/Bowel: Stomach is within normal limits. Visualized bowel is unremarkable. Vascular/Lymphatic:  No evidence of abdominal aortic aneurysm. No suspicious abdominal lymphadenopathy. Other:  No abdominal ascites. Musculoskeletal: No focal osseous lesions. IMPRESSION: Inflammatory changes/fluid in the right upper quadrant, as described above. Primary differential considerations include acute cholecystitis and/or pancreatitis. The pancreatic inflammation may be secondary to cholecystitis, or technically vice versa. Cholelithiasis. Mild periportal edema. No intrahepatic or extrahepatic ductal dilatation. No choledocholithiasis is seen. No peripancreatic fluid collection/pseudocyst. Electronically Signed   By: SJulian HyM.D.   On: 06/18/2017 10:50   Mr Abdomen Mrcp W  Wo Contast  Result Date: 06/18/2017 CLINICAL DATA:  Abdominal pain, vomiting, gallstones EXAM: MRI ABDOMEN WITHOUT AND WITH CONTRAST (INCLUDING MRCP) TECHNIQUE: Multiplanar multisequence MR imaging of the abdomen was performed both before and after the administration of intravenous contrast. Heavily T2-weighted images of the biliary and pancreatic ducts were obtained, and three-dimensional MRCP images were rendered by post processing. CONTRAST:  23m MULTIHANCE GADOBENATE DIMEGLUMINE 529 MG/ML IV SOLN COMPARISON:  Right upper quadrant ultrasound dated 06/18/2017 FINDINGS:  Lower chest: Lung bases are clear. Hepatobiliary: Liver is within normal limits. Mild periportal edema. Small layering gallstones. No convincing gallbladder wall thickening. Mild pericholecystic fluid. No gallbladder distention. Overall, this is considered equivocal, although mild acute cholecystitis is suspected. No intrahepatic or extrahepatic ductal dilatation. No choledocholithiasis is seen. Pancreas: Mild peripancreatic fluid along the pancreatic head/uncinate process, raising the possibility of mild acute pancreatitis, possibly secondary to the gallbladder findings. No peripancreatic fluid collection, atrophy, or ductal dilatation. Spleen:  Within normal limits. Adrenals/Urinary Tract:  Adrenal glands are within normal limits. Kidneys are within normal limits.  No hydronephrosis. Stomach/Bowel: Stomach is within normal limits. Visualized bowel is unremarkable. Vascular/Lymphatic:  No evidence of abdominal aortic aneurysm. No suspicious abdominal lymphadenopathy. Other:  No abdominal ascites. Musculoskeletal: No focal osseous lesions. IMPRESSION: Inflammatory changes/fluid in the right upper quadrant, as described above. Primary differential considerations include acute cholecystitis and/or pancreatitis. The pancreatic inflammation may be secondary to cholecystitis, or technically vice versa. Cholelithiasis. Mild periportal edema. No intrahepatic or extrahepatic ductal dilatation. No choledocholithiasis is seen. No peripancreatic fluid collection/pseudocyst. Electronically Signed   By: SJulian HyM.D.   On: 06/18/2017 10:50   [place] Limited Ruq  Result Date: 06/18/2017 CLINICAL DATA:  Increasing right upper quadrant pain radiating to the back. Known cholelithiasis. Now with elevated LFTs and lipase. EXAM: ULTRASOUND ABDOMEN LIMITED RIGHT UPPER QUADRANT COMPARISON:  Right upper quadrant ultrasound 3 days ago. FINDINGS: Gallbladder: Physiologically distended containing intraluminal gallstones and  sludge. Gallbladder wall thickening and edema with wall thickness of 4 mm. Trace pericholecystic fluid. A positive sonographic Murphy sign noted by sonographer. Common bile duct: Diameter: 5 mm containing echogenic debris/sludge. No discrete choledocholithiasis. Liver: No focal lesion identified. Within normal limits in parenchymal echogenicity. Portal vein is patent on color Doppler imaging with normal direction of blood flow towards the liver. IMPRESSION: 1. Known gallstones, but now with gallbladder wall thickening, pericholecystic fluid and positive sonographic Murphy sign consistent with acute cholecystitis. 2. Echogenic sludge/debris within the common bile duct which remains normal in caliber at 5 mm, but has increased from 3 mm on prior exam. No discrete choledocholithiasis. Electronically Signed   By: MJeb LeveringM.D.   On: 06/18/2017 06:46      Impression / Plan:   Tami CWickwireis a 22y.o. female with acute cholecystitis and gallstone pancreatitis with evidence of choledocholithiasis. MRCP negative for biliary obstruction. There is no indication for ERCP  Gallstone pancreatitis Received a liter bolus in the ER Recommend aggressive IV fluids, administer lactate of fingers 1 L bolus followed by 150 mL per hour Nothing by mouth Pain control Antiemetics as needed  Acute cholecystitis Consult surgery Cholecystectomy after resolution of gallstone pancreatitis Monitor LFTs  Thank you for involving me in the care of this patient. GI will sign off for now. Please call [email or web address] with questions     LOS: 0 days   RSherri Sear MD  06/18/2017, 11:14 AM   Note: This dictation was prepared with Dragon dictation along with smaller phrase technology.  Any transcriptional errors that result from this process are unintentional.

## 2017-06-18 NOTE — ED Triage Notes (Signed)
Pt to triage via w/c with no distress noted; st dx with gallstones on Sunday and needs to sched surgery but having increase in right upper abd pain radiating into back

## 2017-06-18 NOTE — Consult Note (Signed)
Surgical Consultation   Tami Murphy  WVP:[id number]  DOB: 04/03/1995  DOA: 06/18/2017  Requesting physician: Hospitalist  Reason for consultation: Gallstone pancreatitis   History of Present Illness: Tami Murphy is a 22 y.o. female who presents to the ED with a 4-5 day history of right upper quadrant pain, worse following meals. She has never experienced this before. She was seen at [facility] on 06/15/2017 and diagnosed with cholelithiasis and biliary colic. Ultrasound at that time showed gallstones. Labs were normal. She presented last night with worsening right upper quadrant and epigastric pain. Here, she was found to have elevated Alk phose, ALT, AST, lipase, and bilirubin. WBC count was normal. Repeat ultrasound shows cholelithiasis, thickened GB wall and trace pericholecystic fluid. MRCP performed here shows normal CBD without choledocolithiasis and evidence of peripancreatic fluid. The patient is admitted with a diagnosis of gallstone pancreatitis. Of note, she is 2 months postpartum.   Review of Systems:  Review of Systems  Constitutional: Positive for fever.  HENT: Negative for congestion and sore throat.   Eyes: Negative for blurred vision and double vision.  Respiratory: Negative for cough, sputum production, shortness of breath and wheezing.   Cardiovascular: Negative for chest pain, palpitations and leg swelling.  Gastrointestinal: Positive for abdominal pain, diarrhea, nausea and vomiting. Negative for blood in stool, constipation and heartburn.  Genitourinary: Negative for dysuria and hematuria.  Musculoskeletal: Negative for joint pain and myalgias.  Skin: Negative for itching and rash.  Neurological: Positive for headaches. Negative for dizziness and focal weakness.    Past Medical History: Past Medical History:  Diagnosis Date  . Pregnancy induced hypertension     Past Surgical History: Past Surgical History:  Procedure Laterality Date  . NO PAST SURGERIES       Allergies:   Allergies  Allergen Reactions  . Bactrim [Sulfamethoxazole-Trimethoprim] Hives    Social History:  reports that she has never smoked. She has never used smokeless tobacco. She reports that she does not drink alcohol or use drugs.   Family History: Family History  Problem Relation Age of Onset  . Healthy Mother   . Healthy Father   . CVA Maternal Grandfather     Physical Exam: Vitals:   06/18/17 0637 06/18/17 0700 06/18/17 0730 06/18/17 0818  BP: 116/73 114/64 114/80 107/73  Pulse: 77 64 64 73  Resp:   18 16  Temp:    97.7 F (36.5 C)  TempSrc:    Oral  SpO2: 97% 100% 100% 100%  Weight:      Height:        Constitutional: Alert and awake, oriented x3, not in any acute distress. Eyes: EOMI, irises appear normal, anicteric sclera ENMT: external ears and nose appear normal, lips appears normal Neck: neck appears normal, trachea midline CVS: RRR, no LE edema Respiratory:  Respiratory effort normal. No accessory muscle use.  Abdomen: Soft, nondistended, tender to palpation in epigastrium and RUQ, normal bowel sounds Musculoskeletal: no cyanosis, clubbing or edema noted bilaterally Neuro: Cranial nerves II-XII grossly intact, no focal deficits Psych: judgement and insight appear normal, stable mood and affect Skin: no rashes or lesions or ulcers  Data reviewed:  I have personally reviewed following labs and imaging studies  Labs:  CBC: Recent Labs  Lab 06/15/17 1150 06/18/17 0419  WBC 7.0 5.1  HGB 12.9 12.9  HCT 40.2 38.7  MCV 80.2 79.8*  PLT 217 546    Basic Metabolic Panel: Recent Labs  Lab 06/15/17 1150 06/18/17 0419  NA  138 136  K 4.0 3.9  CL 107 103  CO2 25 24  GLUCOSE 95 108*  BUN 12 10  CREATININE 0.98 0.88  CALCIUM 8.7* 9.0   Liver Function Tests: Recent Labs  Lab 06/15/17 1150 06/18/17 0419  AST 17 421*  ALT 19 398*  ALKPHOS 65 182*  BILITOT 0.9 4.4*  PROT 7.2 7.8  ALBUMIN 3.9 3.9   Recent Labs  Lab  06/15/17 1150 06/18/17 0419  LIPASE 23 733*    Inpatient Medications:   Scheduled Meds: . enoxaparin (LOVENOX) injection  40 mg Subcutaneous Q24H   Continuous Infusions: . sodium chloride Stopped (06/18/17 0920)  . lactated ringers 150 mL/hr at 06/18/17 1249  . piperacillin-tazobactam (ZOSYN)  IV 3.375 g (06/18/17 1428)    Radiological Exams on Admission: Mr 3d Recon At Scanner  Result Date: 06/18/2017 CLINICAL DATA:  Abdominal pain, vomiting, gallstones EXAM: MRI ABDOMEN WITHOUT AND WITH CONTRAST (INCLUDING MRCP) TECHNIQUE: Multiplanar multisequence MR imaging of the abdomen was performed both before and after the administration of intravenous contrast. Heavily T2-weighted images of the biliary and pancreatic ducts were obtained, and three-dimensional MRCP images were rendered by post processing. CONTRAST:  22m MULTIHANCE GADOBENATE DIMEGLUMINE 529 MG/ML IV SOLN COMPARISON:  Right upper quadrant ultrasound dated 06/18/2017 FINDINGS: Lower chest: Lung bases are clear. Hepatobiliary: Liver is within normal limits. Mild periportal edema. Small layering gallstones. No convincing gallbladder wall thickening. Mild pericholecystic fluid. No gallbladder distention. Overall, this is considered equivocal, although mild acute cholecystitis is suspected. No intrahepatic or extrahepatic ductal dilatation. No choledocholithiasis is seen. Pancreas: Mild peripancreatic fluid along the pancreatic head/uncinate process, raising the possibility of mild acute pancreatitis, possibly secondary to the gallbladder findings. No peripancreatic fluid collection, atrophy, or ductal dilatation. Spleen:  Within normal limits. Adrenals/Urinary Tract:  Adrenal glands are within normal limits. Kidneys are within normal limits.  No hydronephrosis. Stomach/Bowel: Stomach is within normal limits. Visualized bowel is unremarkable. Vascular/Lymphatic:  No evidence of abdominal aortic aneurysm. No suspicious abdominal  lymphadenopathy. Other:  No abdominal ascites. Musculoskeletal: No focal osseous lesions. IMPRESSION: Inflammatory changes/fluid in the right upper quadrant, as described above. Primary differential considerations include acute cholecystitis and/or pancreatitis. The pancreatic inflammation may be secondary to cholecystitis, or technically vice versa. Cholelithiasis. Mild periportal edema. No intrahepatic or extrahepatic ductal dilatation. No choledocholithiasis is seen. No peripancreatic fluid collection/pseudocyst. Electronically Signed   By: SJulian HyM.D.   On: 06/18/2017 10:50   Mr Abdomen Mrcp W Wo Contast  Result Date: 06/18/2017 CLINICAL DATA:  Abdominal pain, vomiting, gallstones EXAM: MRI ABDOMEN WITHOUT AND WITH CONTRAST (INCLUDING MRCP) TECHNIQUE: Multiplanar multisequence MR imaging of the abdomen was performed both before and after the administration of intravenous contrast. Heavily T2-weighted images of the biliary and pancreatic ducts were obtained, and three-dimensional MRCP images were rendered by post processing. CONTRAST:  163mMULTIHANCE GADOBENATE DIMEGLUMINE 529 MG/ML IV SOLN COMPARISON:  Right upper quadrant ultrasound dated 06/18/2017 FINDINGS: Lower chest: Lung bases are clear. Hepatobiliary: Liver is within normal limits. Mild periportal edema. Small layering gallstones. No convincing gallbladder wall thickening. Mild pericholecystic fluid. No gallbladder distention. Overall, this is considered equivocal, although mild acute cholecystitis is suspected. No intrahepatic or extrahepatic ductal dilatation. No choledocholithiasis is seen. Pancreas: Mild peripancreatic fluid along the pancreatic head/uncinate process, raising the possibility of mild acute pancreatitis, possibly secondary to the gallbladder findings. No peripancreatic fluid collection, atrophy, or ductal dilatation. Spleen:  Within normal limits. Adrenals/Urinary Tract:  Adrenal glands are within normal limits. Kidneys  are  within normal limits.  No hydronephrosis. Stomach/Bowel: Stomach is within normal limits. Visualized bowel is unremarkable. Vascular/Lymphatic:  No evidence of abdominal aortic aneurysm. No suspicious abdominal lymphadenopathy. Other:  No abdominal ascites. Musculoskeletal: No focal osseous lesions. IMPRESSION: Inflammatory changes/fluid in the right upper quadrant, as described above. Primary differential considerations include acute cholecystitis and/or pancreatitis. The pancreatic inflammation may be secondary to cholecystitis, or technically vice versa. Cholelithiasis. Mild periportal edema. No intrahepatic or extrahepatic ductal dilatation. No choledocholithiasis is seen. No peripancreatic fluid collection/pseudocyst. Electronically Signed   By: Julian Hy M.D.   On: 06/18/2017 10:50   US Abdomen Limited Ruq  Result Date: 06/18/2017 CLINICAL DATA:  Increasing right upper quadrant pain radiating to the back. Known cholelithiasis. Now with elevated LFTs and lipase. EXAM: ULTRASOUND ABDOMEN LIMITED RIGHT UPPER QUADRANT COMPARISON:  Right upper quadrant ultrasound 3 days ago. FINDINGS: Gallbladder: Physiologically distended containing intraluminal gallstones and sludge. Gallbladder wall thickening and edema with wall thickness of 4 mm. Trace pericholecystic fluid. A positive sonographic Murphy sign noted by sonographer. Common bile duct: Diameter: 5 mm containing echogenic debris/sludge. No discrete choledocholithiasis. Liver: No focal lesion identified. Within normal limits in parenchymal echogenicity. Portal vein is patent on color Doppler imaging with normal direction of blood flow towards the liver. IMPRESSION: 1. Known gallstones, but now with gallbladder wall thickening, pericholecystic fluid and positive sonographic Murphy sign consistent with acute cholecystitis. 2. Echogenic sludge/debris within the common bile duct which remains normal in caliber at 5 mm, but has increased from 3 mm on  prior exam. No discrete choledocholithiasis. Electronically Signed   By: Jeb Levering M.D.   On: 06/18/2017 06:46    Impression/Recommendations Active Problems:   Gallstone pancreatitis  The patient is a 22 year-old female who is 2 months postpartum who is admitted with cholecystitis and gallstone pancreatitis. Her pain is improved since admission.   - We recommend cholecystectomy during this admission after pancreatitis resolves. Continue to trend lipase.  - Although bilirubin is elevated, MRCP shows no evidence of choledocolithiasis. GI recommendation is that ERCP is not necessary. Continue to trend bilirubin.  - Continue pain control - Continue antibiotic for cholecystitis - Continue IVF hydration - Continue DVT prophylaxis - Encourage ambulation   Thank you for this consultation.  We appreciate the opportunity to participate in the care of this patient.   [place].

## 2017-06-19 DIAGNOSIS — K851 Biliary acute pancreatitis without necrosis or infection: Principal | ICD-10-CM

## 2017-06-19 LAB — CBC
HEMATOCRIT: 36.1 % (ref 35.0–47.0)
Hemoglobin: 12.1 g/dL (ref 12.0–16.0)
MCH: 26.8 pg (ref 26.0–34.0)
MCHC: 33.4 g/dL (ref 32.0–36.0)
MCV: 80 fL (ref 80.0–100.0)
PLATELETS: 181 10*3/uL (ref 150–440)
RBC: 4.51 MIL/uL (ref 3.80–5.20)
RDW: 13.8 % (ref 11.5–14.5)
WBC: 4.9 10*3/uL (ref 3.6–11.0)

## 2017-06-19 LAB — HEPATIC FUNCTION PANEL
ALT: 255 U/L — AB (ref 14–54)
AST: 121 U/L — ABNORMAL HIGH (ref 15–41)
Albumin: 3.5 g/dL (ref 3.5–5.0)
Alkaline Phosphatase: 168 U/L — ABNORMAL HIGH (ref 38–126)
BILIRUBIN DIRECT: 0.4 mg/dL (ref 0.1–0.5)
BILIRUBIN INDIRECT: 2.1 mg/dL — AB (ref 0.3–0.9)
BILIRUBIN TOTAL: 2.5 mg/dL — AB (ref 0.3–1.2)
Total Protein: 6.6 g/dL (ref 6.5–8.1)

## 2017-06-19 LAB — LIPASE, BLOOD: Lipase: 122 U/L — ABNORMAL HIGH (ref 11–51)

## 2017-06-19 LAB — BASIC METABOLIC PANEL
Anion gap: 7 (ref 5–15)
BUN: 13 mg/dL (ref 6–20)
CHLORIDE: 108 mmol/L (ref 101–111)
CO2: 21 mmol/L — AB (ref 22–32)
CREATININE: 1.01 mg/dL — AB (ref 0.44–1.00)
Calcium: 8.4 mg/dL — ABNORMAL LOW (ref 8.9–10.3)
GFR calc non Af Amer: >60 mL/min (ref >60)
Glucose, Bld: 78 mg/dL (ref 65–99)
Potassium: 3.7 mmol/L (ref 3.5–5.1)
Sodium: 136 mmol/L (ref 135–145)

## 2017-06-19 LAB — HIV ANTIBODY (ROUTINE TESTING W REFLEX): HIV Screen 4th Generation wRfx: NONREACTIVE

## 2017-06-19 MED ORDER — SODIUM CHLORIDE 0.9 % IV SOLN: INTRAVENOUS | Status: DC | PRN

## 2017-06-19 NOTE — Progress Notes (Signed)
Patient ID: Tami Murphy, female   DOB: [birth year], 22 y.o.   MRN: [id number]  [employer] PROGRESS NOTE  Tami Murphy ZHY:[id number] DOB: 06-24-1995 DOA: 06/18/2017 PCP: Patient, No Pcp Per  HPI/Subjective: Patient feeling much better.  No abdominal pain.  Offers no complaints.  Objective: Vitals:   06/18/17 2033 06/19/17 0516  BP: 111/64 121/84  Pulse: (!) 53 62  Resp: 16 18  Temp: 98.4 F (36.9 C) 98.3 F (36.8 C)  SpO2: 100% 100%    Filed Weights   06/18/17 0329 06/18/17 0330  Weight: 84.4 kg (186 lb) 84.4 kg (186 lb)    ROS: Review of Systems  Constitutional: Negative for chills and fever.  Eyes: Negative for blurred vision.  Respiratory: Negative for cough and shortness of breath.   Cardiovascular: Negative for chest pain.  Gastrointestinal: Negative for abdominal pain, constipation, diarrhea, nausea and vomiting.  Genitourinary: Negative for dysuria.  Musculoskeletal: Negative for joint pain.  Neurological: Negative for dizziness and headaches.   Exam: Physical Exam  Constitutional: She is oriented to person, place, and time.  HENT:  Nose: No mucosal edema.  Mouth/Throat: No oropharyngeal exudate or posterior oropharyngeal edema.  Eyes: Pupils are equal, round, and reactive to light. Conjunctivae, EOM and lids are normal.  Neck: No JVD present. Carotid bruit is not present. No edema present. No thyroid mass and no thyromegaly present.  Cardiovascular: S1 normal and S2 normal. Exam reveals no gallop.  No murmur heard. Pulses:      Dorsalis pedis pulses are 2+ on the right side, and 2+ on the left side.  Respiratory: No respiratory distress. She has no wheezes. She has no rhonchi. She has no rales.  GI: Soft. Bowel sounds are normal. There is no tenderness.  Musculoskeletal:       Right ankle: She exhibits no swelling.       Left ankle: She exhibits no swelling.  Lymphadenopathy:    She has no cervical adenopathy.  Neurological: She is alert and oriented  to person, place, and time. No cranial nerve deficit.  Skin: Skin is warm. No rash noted. Nails show no clubbing.  Psychiatric: She has a normal mood and affect.      Data Reviewed: Basic Metabolic Panel: Recent Labs  Lab 06/15/17 1150 06/18/17 0419 06/19/17 0416  NA 138 136 136  K 4.0 3.9 3.7  CL 107 103 108  CO2 25 24 21*  GLUCOSE 95 108* 78  BUN 12 10 13   CREATININE 0.98 0.88 1.01*  CALCIUM 8.7* 9.0 8.4*   Liver Function Tests: Recent Labs  Lab 06/15/17 1150 06/18/17 0419 06/19/17 0416  AST 17 421* 121*  ALT 19 398* 255*  ALKPHOS 65 182* 168*  BILITOT 0.9 4.4* 2.5*  PROT 7.2 7.8 6.6  ALBUMIN 3.9 3.9 3.5   Recent Labs  Lab 06/15/17 1150 06/18/17 0419 06/19/17 0416  LIPASE 23 733* 122*   No results for input(s): AMMONIA in the last 168 hours. CBC: Recent Labs  Lab 06/15/17 1150 06/18/17 0419 06/19/17 0416  WBC 7.0 5.1 4.9  HGB 12.9 12.9 12.1  HCT 40.2 38.7 36.1  MCV 80.2 79.8* 80.0  PLT 217 193 181      Studies: Mr 3d Recon At Scanner  Result Date: 06/18/2017 CLINICAL DATA:  Abdominal pain, vomiting, gallstones EXAM: MRI ABDOMEN WITHOUT AND WITH CONTRAST (INCLUDING MRCP) TECHNIQUE: Multiplanar multisequence MR imaging of the abdomen was performed both before and after the administration of intravenous contrast. Heavily T2-weighted images of the biliary  and pancreatic ducts were obtained, and three-dimensional MRCP images were rendered by post processing. CONTRAST:  17mL MULTIHANCE GADOBENATE DIMEGLUMINE 529 MG/ML IV SOLN COMPARISON:  Right upper quadrant ultrasound dated 06/18/2017 FINDINGS: Lower chest: Lung bases are clear. Hepatobiliary: Liver is within normal limits. Mild periportal edema. Small layering gallstones. No convincing gallbladder wall thickening. Mild pericholecystic fluid. No gallbladder distention. Overall, this is considered equivocal, although mild acute cholecystitis is suspected. No intrahepatic or extrahepatic ductal dilatation. No  choledocholithiasis is seen. Pancreas: Mild peripancreatic fluid along the pancreatic head/uncinate process, raising the possibility of mild acute pancreatitis, possibly secondary to the gallbladder findings. No peripancreatic fluid collection, atrophy, or ductal dilatation. Spleen:  Within normal limits. Adrenals/Urinary Tract:  Adrenal glands are within normal limits. Kidneys are within normal limits.  No hydronephrosis. Stomach/Bowel: Stomach is within normal limits. Visualized bowel is unremarkable. Vascular/Lymphatic:  No evidence of abdominal aortic aneurysm. No suspicious abdominal lymphadenopathy. Other:  No abdominal ascites. Musculoskeletal: No focal osseous lesions. IMPRESSION: Inflammatory changes/fluid in the right upper quadrant, as described above. Primary differential considerations include acute cholecystitis and/or pancreatitis. The pancreatic inflammation may be secondary to cholecystitis, or technically vice versa. Cholelithiasis. Mild periportal edema. No intrahepatic or extrahepatic ductal dilatation. No choledocholithiasis is seen. No peripancreatic fluid collection/pseudocyst. Electronically Signed   By: Charline Bills M.D.   On: 06/18/2017 10:50   Mr Abdomen Mrcp W Wo Contast  Result Date: 06/18/2017 CLINICAL DATA:  Abdominal pain, vomiting, gallstones EXAM: MRI ABDOMEN WITHOUT AND WITH CONTRAST (INCLUDING MRCP) TECHNIQUE: Multiplanar multisequence MR imaging of the abdomen was performed both before and after the administration of intravenous contrast. Heavily T2-weighted images of the biliary and pancreatic ducts were obtained, and three-dimensional MRCP images were rendered by post processing. CONTRAST:  17mL MULTIHANCE GADOBENATE DIMEGLUMINE 529 MG/ML IV SOLN COMPARISON:  Right upper quadrant ultrasound dated 06/18/2017 FINDINGS: Lower chest: Lung bases are clear. Hepatobiliary: Liver is within normal limits. Mild periportal edema. Small layering gallstones. No convincing  gallbladder wall thickening. Mild pericholecystic fluid. No gallbladder distention. Overall, this is considered equivocal, although mild acute cholecystitis is suspected. No intrahepatic or extrahepatic ductal dilatation. No choledocholithiasis is seen. Pancreas: Mild peripancreatic fluid along the pancreatic head/uncinate process, raising the possibility of mild acute pancreatitis, possibly secondary to the gallbladder findings. No peripancreatic fluid collection, atrophy, or ductal dilatation. Spleen:  Within normal limits. Adrenals/Urinary Tract:  Adrenal glands are within normal limits. Kidneys are within normal limits.  No hydronephrosis. Stomach/Bowel: Stomach is within normal limits. Visualized bowel is unremarkable. Vascular/Lymphatic:  No evidence of abdominal aortic aneurysm. No suspicious abdominal lymphadenopathy. Other:  No abdominal ascites. Musculoskeletal: No focal osseous lesions. IMPRESSION: Inflammatory changes/fluid in the right upper quadrant, as described above. Primary differential considerations include acute cholecystitis and/or pancreatitis. The pancreatic inflammation may be secondary to cholecystitis, or technically vice versa. Cholelithiasis. Mild periportal edema. No intrahepatic or extrahepatic ductal dilatation. No choledocholithiasis is seen. No peripancreatic fluid collection/pseudocyst. Electronically Signed   By: Charline Bills M.D.   On: 06/18/2017 10:50   US Abdomen Limited Ruq  Result Date: 06/18/2017 CLINICAL DATA:  Increasing right upper quadrant pain radiating to the back. Known cholelithiasis. Now with elevated LFTs and lipase. EXAM: ULTRASOUND ABDOMEN LIMITED RIGHT UPPER QUADRANT COMPARISON:  Right upper quadrant ultrasound 3 days ago. FINDINGS: Gallbladder: Physiologically distended containing intraluminal gallstones and sludge. Gallbladder wall thickening and edema with wall thickness of 4 mm. Trace pericholecystic fluid. A positive sonographic Murphy sign noted  by sonographer. Common bile duct: Diameter: 5 mm containing  echogenic debris/sludge. No discrete choledocholithiasis. Liver: No focal lesion identified. Within normal limits in parenchymal echogenicity. Portal vein is patent on color Doppler imaging with normal direction of blood flow towards the liver. IMPRESSION: 1. Known gallstones, but now with gallbladder wall thickening, pericholecystic fluid and positive sonographic Murphy sign consistent with acute cholecystitis. 2. Echogenic sludge/debris within the common bile duct which remains normal in caliber at 5 mm, but has increased from 3 mm on prior exam. No discrete choledocholithiasis. Electronically Signed   By: Rubye OaksMelanie  Ehinger M.D.   On: 06/18/2017 06:46    Scheduled Meds: . enoxaparin (LOVENOX) injection  40 mg Subcutaneous Q24H   Continuous Infusions: . lactated ringers 150 mL/hr at 06/19/17 0842  . piperacillin-tazobactam (ZOSYN)  IV Stopped (06/19/17 0954)    Assessment/Plan:  1. Acute gallstone pancreatitis with acute cholecystitis, elevated liver function test and bilirubin.  General surgery started clear liquid diet.  Continue IV fluid hydration.  MRCP negative for gallstone in the ducts.  Lipase trending better.  Liver function test and bilirubin trending better.  Empiric antibiotics.  Likely the lipase will normalize tomorrow where she can have a laparoscopic cholecystectomy tomorrow.  Code Status:     Code Status Orders  (From admission, onward)        Start     Ordered   06/18/17 0751  Full code  Continuous     06/18/17 0751    Code Status History    Date Active Date Inactive Code Status Order ID Comments User Context   04/22/2017 2101 04/24/2017 1919 Full Code [id number]  Hurshel PartyLeftwich-Kirby, Lisa A, CNM Inpatient   04/21/2017 1327 04/22/2017 2101 Full Code [id number]  Pincus LargePhelps, Jazma Y, DO Inpatient     Family Communication:  family bedside Disposition Plan: Likely home 24 hours after  cholecystectomy.  Consultants:  Gastroenterology  General surgery  Antibiotics:  Zosyn  Time spent: 28 minutes  Brenson Hartman [place]  [employer]

## 2017-06-19 NOTE — Progress Notes (Signed)
CC: Biliary pancreatitis Subjective: Total of biliary pancreatitis.  Today her pain is much improved and almost gone.  She has no nausea vomiting fevers or chills.  Objective: Vital signs in last 24 hours: Temp:  [98.3 F (36.8 C)-98.4 F (36.9 C)] 98.3 F (36.8 C) (06/13 0516) Pulse Rate:  [53-62] 62 (06/13 0516) Resp:  [16-18] 18 (06/13 0516) BP: (111-121)/(64-84) 121/84 (06/13 0516) SpO2:  [100 %] 100 % (06/13 0516) Last BM Date: 06/18/17  Intake/Output from previous day: 06/12 0701 - 06/13 0700 In: 3348 [I.V.:2219; IV Piggyback:1129] Out: 0  Intake/Output this shift: No intake/output data recorded.  Physical exam:  Vital signs stable and reviewed.  Patient appears comfortable.  Abdomen is soft nondistended nontympanitic minimally tender in the epigastrium and right upper quadrant.  Calves are nontender no icterus no jaundice  Lab Results: CBC  Recent Labs    06/18/17 0419 06/19/17 0416  WBC 5.1 4.9  HGB 12.9 12.1  HCT 38.7 36.1  PLT 193 181   BMET Recent Labs    06/18/17 0419 06/19/17 0416  NA 136 136  K 3.9 3.7  CL 103 108  CO2 24 21*  GLUCOSE 108* 78  BUN 10 13  CREATININE 0.88 1.01*  CALCIUM 9.0 8.4*   PT/INR No results for input(s): LABPROT, INR in the last 72 hours. ABG No results for input(s): PHART, HCO3 in the last 72 hours.  Invalid input(s): PCO2, PO2  Studies/Results: Mr 3d Recon At Scanner  Result Date: 06/18/2017 CLINICAL DATA:  Abdominal pain, vomiting, gallstones EXAM: MRI ABDOMEN WITHOUT AND WITH CONTRAST (INCLUDING MRCP) TECHNIQUE: Multiplanar multisequence MR imaging of the abdomen was performed both before and after the administration of intravenous contrast. Heavily T2-weighted images of the biliary and pancreatic ducts were obtained, and three-dimensional MRCP images were rendered by post processing. CONTRAST:  17mL MULTIHANCE GADOBENATE DIMEGLUMINE 529 MG/ML IV SOLN COMPARISON:  Right upper quadrant ultrasound dated 06/18/2017  FINDINGS: Lower chest: Lung bases are clear. Hepatobiliary: Liver is within normal limits. Mild periportal edema. Small layering gallstones. No convincing gallbladder wall thickening. Mild pericholecystic fluid. No gallbladder distention. Overall, this is considered equivocal, although mild acute cholecystitis is suspected. No intrahepatic or extrahepatic ductal dilatation. No choledocholithiasis is seen. Pancreas: Mild peripancreatic fluid along the pancreatic head/uncinate process, raising the possibility of mild acute pancreatitis, possibly secondary to the gallbladder findings. No peripancreatic fluid collection, atrophy, or ductal dilatation. Spleen:  Within normal limits. Adrenals/Urinary Tract:  Adrenal glands are within normal limits. Kidneys are within normal limits.  No hydronephrosis. Stomach/Bowel: Stomach is within normal limits. Visualized bowel is unremarkable. Vascular/Lymphatic:  No evidence of abdominal aortic aneurysm. No suspicious abdominal lymphadenopathy. Other:  No abdominal ascites. Musculoskeletal: No focal osseous lesions. IMPRESSION: Inflammatory changes/fluid in the right upper quadrant, as described above. Primary differential considerations include acute cholecystitis and/or pancreatitis. The pancreatic inflammation may be secondary to cholecystitis, or technically vice versa. Cholelithiasis. Mild periportal edema. No intrahepatic or extrahepatic ductal dilatation. No choledocholithiasis is seen. No peripancreatic fluid collection/pseudocyst. Electronically Signed   By: Charline Bills M.D.   On: 06/18/2017 10:50   Mr Abdomen Mrcp W Wo Contast  Result Date: 06/18/2017 CLINICAL DATA:  Abdominal pain, vomiting, gallstones EXAM: MRI ABDOMEN WITHOUT AND WITH CONTRAST (INCLUDING MRCP) TECHNIQUE: Multiplanar multisequence MR imaging of the abdomen was performed both before and after the administration of intravenous contrast. Heavily T2-weighted images of the biliary and pancreatic  ducts were obtained, and three-dimensional MRCP images were rendered by post processing. CONTRAST:  17mL  MULTIHANCE GADOBENATE DIMEGLUMINE 529 MG/ML IV SOLN COMPARISON:  Right upper quadrant ultrasound dated 06/18/2017 FINDINGS: Lower chest: Lung bases are clear. Hepatobiliary: Liver is within normal limits. Mild periportal edema. Small layering gallstones. No convincing gallbladder wall thickening. Mild pericholecystic fluid. No gallbladder distention. Overall, this is considered equivocal, although mild acute cholecystitis is suspected. No intrahepatic or extrahepatic ductal dilatation. No choledocholithiasis is seen. Pancreas: Mild peripancreatic fluid along the pancreatic head/uncinate process, raising the possibility of mild acute pancreatitis, possibly secondary to the gallbladder findings. No peripancreatic fluid collection, atrophy, or ductal dilatation. Spleen:  Within normal limits. Adrenals/Urinary Tract:  Adrenal glands are within normal limits. Kidneys are within normal limits.  No hydronephrosis. Stomach/Bowel: Stomach is within normal limits. Visualized bowel is unremarkable. Vascular/Lymphatic:  No evidence of abdominal aortic aneurysm. No suspicious abdominal lymphadenopathy. Other:  No abdominal ascites. Musculoskeletal: No focal osseous lesions. IMPRESSION: Inflammatory changes/fluid in the right upper quadrant, as described above. Primary differential considerations include acute cholecystitis and/or pancreatitis. The pancreatic inflammation may be secondary to cholecystitis, or technically vice versa. Cholelithiasis. Mild periportal edema. No intrahepatic or extrahepatic ductal dilatation. No choledocholithiasis is seen. No peripancreatic fluid collection/pseudocyst. Electronically Signed   By: Charline Bills M.D.   On: 06/18/2017 10:50   US Abdomen Limited Ruq  Result Date: 06/18/2017 CLINICAL DATA:  Increasing right upper quadrant pain radiating to the back. Known cholelithiasis. Now  with elevated LFTs and lipase. EXAM: ULTRASOUND ABDOMEN LIMITED RIGHT UPPER QUADRANT COMPARISON:  Right upper quadrant ultrasound 3 days ago. FINDINGS: Gallbladder: Physiologically distended containing intraluminal gallstones and sludge. Gallbladder wall thickening and edema with wall thickness of 4 mm. Trace pericholecystic fluid. A positive sonographic Murphy sign noted by sonographer. Common bile duct: Diameter: 5 mm containing echogenic debris/sludge. No discrete choledocholithiasis. Liver: No focal lesion identified. Within normal limits in parenchymal echogenicity. Portal vein is patent on color Doppler imaging with normal direction of blood flow towards the liver. IMPRESSION: 1. Known gallstones, but now with gallbladder wall thickening, pericholecystic fluid and positive sonographic Murphy sign consistent with acute cholecystitis. 2. Echogenic sludge/debris within the common bile duct which remains normal in caliber at 5 mm, but has increased from 3 mm on prior exam. No discrete choledocholithiasis. Electronically Signed   By: Rubye Oaks M.D.   On: 06/18/2017 06:46    Anti-infectives: Anti-infectives (From admission, onward)   Start     Dose/Rate Route Frequency Ordered Stop   06/18/17 1400  piperacillin-tazobactam (ZOSYN) IVPB 3.375 g     3.375 g 12.5 mL/hr over 240 Minutes Intravenous Every 8 hours 06/18/17 0903     06/18/17 0800  piperacillin-tazobactam (ZOSYN) IVPB 3.375 g     3.375 g 100 mL/hr over 30 Minutes Intravenous  Once 06/18/17 0757 06/18/17 0915      Assessment/Plan:  Labs reviewed.  Lipase AST ALT and bilirubin are still elevated.  They have improved considerably and lipase is almost normal.  I believe the patient would benefit from 1 more day of bowel rest with the exception of clear liquids and then proceed with laparoscopic cholecystectomy with cholangiography tomorrow.  I discussed with the patient the plans and the options of observation as well as the risks of  bleeding infection recurrence.  We also discussed the risks of conversion to an open procedure bile duct damage bile duct leak retained common bile duct stone in it which could require further surgery and/or ERCP stent and papillotomy this is reviewed for her she understood and agreed to proceed.  I also  informed her that Dr. Earlene Plateravis would be doing the surgery tomorrow.  Lattie Hawichard E Cooper, MD, FACS  06/19/2017

## 2017-06-19 NOTE — Progress Notes (Signed)
Arlyss Repress, MD [address]  [address]  [place], [place] [address]  Main: [phone]  Fax: [phone] Pager: [phone]   Subjective: Patient is asymptomatic, tolerated clear liquid diet well   Objective: Vital signs in last 24 hours: Vitals:   06/18/17 0730 06/18/17 0818 06/18/17 2033 06/19/17 0516  BP: 114/80 107/73 111/64 121/84  Pulse: 64 73 (!) 53 62  Resp: 18 16 16 18   Temp:  97.7 F (36.5 C) 98.4 F (36.9 C) 98.3 F (36.8 C)  TempSrc:  Oral Oral Oral  SpO2: 100% 100% 100% 100%  Weight:      Height:       Weight change:   Intake/Output Summary (Last 24 hours) at 06/19/2017 1510 Last data filed at 06/19/2017 0954 Gross per 24 hour  Intake 2908 ml  Output 0 ml  Net 2908 ml     Exam: Heart:: Regular rate and rhythm or S1S2 present Lungs: clear to auscultation Abdomen: soft, nontender, normal bowel sounds   Lab Results: CBC Latest Ref Rng & Units 06/19/2017 06/18/2017 06/15/2017  WBC 3.6 - 11.0 K/uL 4.9 5.1 7.0  Hemoglobin 12.0 - 16.0 g/dL 96.2 95.2 84.1  Hematocrit 35.0 - 47.0 % 36.1 38.7 40.2  Platelets 150 - 440 K/uL 181 193 217   Micro Results: No results found for this or any previous visit (from the past 240 hour(s)). Studies/Results: Mr 3d Recon At Scanner  Result Date: 06/18/2017 CLINICAL DATA:  Abdominal pain, vomiting, gallstones EXAM: MRI ABDOMEN WITHOUT AND WITH CONTRAST (INCLUDING MRCP) TECHNIQUE: Multiplanar multisequence MR imaging of the abdomen was performed both before and after the administration of intravenous contrast. Heavily T2-weighted images of the biliary and pancreatic ducts were obtained, and three-dimensional MRCP images were rendered by post processing. CONTRAST:  17mL MULTIHANCE GADOBENATE DIMEGLUMINE 529 MG/ML IV SOLN COMPARISON:  Right upper quadrant ultrasound dated 06/18/2017 FINDINGS: Lower chest: Lung bases are clear. Hepatobiliary: Liver is within normal limits. Mild periportal edema. Small layering  gallstones. No convincing gallbladder wall thickening. Mild pericholecystic fluid. No gallbladder distention. Overall, this is considered equivocal, although mild acute cholecystitis is suspected. No intrahepatic or extrahepatic ductal dilatation. No choledocholithiasis is seen. Pancreas: Mild peripancreatic fluid along the pancreatic head/uncinate process, raising the possibility of mild acute pancreatitis, possibly secondary to the gallbladder findings. No peripancreatic fluid collection, atrophy, or ductal dilatation. Spleen:  Within normal limits. Adrenals/Urinary Tract:  Adrenal glands are within normal limits. Kidneys are within normal limits.  No hydronephrosis. Stomach/Bowel: Stomach is within normal limits. Visualized bowel is unremarkable. Vascular/Lymphatic:  No evidence of abdominal aortic aneurysm. No suspicious abdominal lymphadenopathy. Other:  No abdominal ascites. Musculoskeletal: No focal osseous lesions. IMPRESSION: Inflammatory changes/fluid in the right upper quadrant, as described above. Primary differential considerations include acute cholecystitis and/or pancreatitis. The pancreatic inflammation may be secondary to cholecystitis, or technically vice versa. Cholelithiasis. Mild periportal edema. No intrahepatic or extrahepatic ductal dilatation. No choledocholithiasis is seen. No peripancreatic fluid collection/pseudocyst. Electronically Signed   By: Charline Bills M.D.   On: 06/18/2017 10:50   Mr Abdomen Mrcp W Wo Contast  Result Date: 06/18/2017 CLINICAL DATA:  Abdominal pain, vomiting, gallstones EXAM: MRI ABDOMEN WITHOUT AND WITH CONTRAST (INCLUDING MRCP) TECHNIQUE: Multiplanar multisequence MR imaging of the abdomen was performed both before and after the administration of intravenous contrast. Heavily T2-weighted images of the biliary and pancreatic ducts were obtained, and three-dimensional MRCP images were rendered by post processing. CONTRAST:  17mL MULTIHANCE GADOBENATE  DIMEGLUMINE 529 MG/ML IV SOLN  COMPARISON:  Right upper quadrant ultrasound dated 06/18/2017 FINDINGS: Lower chest: Lung bases are clear. Hepatobiliary: Liver is within normal limits. Mild periportal edema. Small layering gallstones. No convincing gallbladder wall thickening. Mild pericholecystic fluid. No gallbladder distention. Overall, this is considered equivocal, although mild acute cholecystitis is suspected. No intrahepatic or extrahepatic ductal dilatation. No choledocholithiasis is seen. Pancreas: Mild peripancreatic fluid along the pancreatic head/uncinate process, raising the possibility of mild acute pancreatitis, possibly secondary to the gallbladder findings. No peripancreatic fluid collection, atrophy, or ductal dilatation. Spleen:  Within normal limits. Adrenals/Urinary Tract:  Adrenal glands are within normal limits. Kidneys are within normal limits.  No hydronephrosis. Stomach/Bowel: Stomach is within normal limits. Visualized bowel is unremarkable. Vascular/Lymphatic:  No evidence of abdominal aortic aneurysm. No suspicious abdominal lymphadenopathy. Other:  No abdominal ascites. Musculoskeletal: No focal osseous lesions. IMPRESSION: Inflammatory changes/fluid in the right upper quadrant, as described above. Primary differential considerations include acute cholecystitis and/or pancreatitis. The pancreatic inflammation may be secondary to cholecystitis, or technically vice versa. Cholelithiasis. Mild periportal edema. No intrahepatic or extrahepatic ductal dilatation. No choledocholithiasis is seen. No peripancreatic fluid collection/pseudocyst. Electronically Signed   By: Charline BillsSriyesh  Krishnan M.D.   On: 06/18/2017 10:50   [place] Abdomen Limited Ruq  Result Date: 06/18/2017 CLINICAL DATA:  Increasing right upper quadrant pain radiating to the back. Known cholelithiasis. Now with elevated LFTs and lipase. EXAM: ULTRASOUND ABDOMEN LIMITED RIGHT UPPER QUADRANT COMPARISON:  Right upper quadrant ultrasound  3 days ago. FINDINGS: Gallbladder: Physiologically distended containing intraluminal gallstones and sludge. Gallbladder wall thickening and edema with wall thickness of 4 mm. Trace pericholecystic fluid. A positive sonographic Murphy sign noted by sonographer. Common bile duct: Diameter: 5 mm containing echogenic debris/sludge. No discrete choledocholithiasis. Liver: No focal lesion identified. Within normal limits in parenchymal echogenicity. Portal vein is patent on color Doppler imaging with normal direction of blood flow towards the liver. IMPRESSION: 1. Known gallstones, but now with gallbladder wall thickening, pericholecystic fluid and positive sonographic Murphy sign consistent with acute cholecystitis. 2. Echogenic sludge/debris within the common bile duct which remains normal in caliber at 5 mm, but has increased from 3 mm on prior exam. No discrete choledocholithiasis. Electronically Signed   By: Rubye OaksMelanie  Ehinger M.D.   On: 06/18/2017 06:46   Medications: I have reviewed the patient's current medications. Scheduled Meds: . enoxaparin (LOVENOX) injection  40 mg Subcutaneous Q24H   Continuous Infusions: . sodium chloride    . lactated ringers 100 mL/hr at 06/19/17 1355  . piperacillin-tazobactam (ZOSYN)  IV 3.375 g (06/19/17 1355)   PRN Meds:.sodium chloride, acetaminophen **OR** acetaminophen, morphine injection, ondansetron **OR** ondansetron (ZOFRAN) IV, oxyCODONE   Assessment: Active Problems:   Gallstone pancreatitis  Gallstone pancreatitis resolved LFTs are improving  Plan: Continue clear liquid diet Discontinue IV fluids Cholecystectomy as inpatient in next 24-48 hours GI will sign off for now   LOS: 1 day   Rohini Vanga 06/19/2017, 3:10 PM

## 2017-06-20 ENCOUNTER — Inpatient Hospital Stay: Payer: Medicaid Other

## 2017-06-20 ENCOUNTER — Encounter: Payer: Self-pay | Admitting: Anesthesiology

## 2017-06-20 ENCOUNTER — Inpatient Hospital Stay: Payer: Medicaid Other | Admitting: Anesthesiology

## 2017-06-20 ENCOUNTER — Encounter
Admission: EM | Disposition: A | Discharge status: Home / Self Care | Payer: Self-pay | Source: Home / Self Care | Attending: Internal Medicine

## 2017-06-20 HISTORY — PX: CHOLECYSTECTOMY: SHX55

## 2017-06-20 LAB — CBC WITH DIFFERENTIAL/PLATELET
Basophils Absolute: 0 10*3/uL (ref 0–0.1)
Basophils Relative: 0 %
EOS ABS: 0.1 10*3/uL (ref 0–0.7)
EOS PCT: 2 %
HCT: 37.8 % (ref 35.0–47.0)
HEMOGLOBIN: 12.5 g/dL (ref 12.0–16.0)
LYMPHS ABS: 1.7 10*3/uL (ref 1.0–3.6)
LYMPHS PCT: 37 %
MCH: 26.3 pg (ref 26.0–34.0)
MCHC: 33.1 g/dL (ref 32.0–36.0)
MCV: 79.7 fL — AB (ref 80.0–100.0)
Monocytes Absolute: 0.3 10*3/uL (ref 0.2–0.9)
Monocytes Relative: 6 %
NEUTROS PCT: 55 %
Neutro Abs: 2.5 10*3/uL (ref 1.4–6.5)
PLATELETS: 193 10*3/uL (ref 150–440)
RBC: 4.74 MIL/uL (ref 3.80–5.20)
RDW: 13.7 % (ref 11.5–14.5)
WBC: 4.6 10*3/uL (ref 3.6–11.0)

## 2017-06-20 LAB — COMPREHENSIVE METABOLIC PANEL
ALT: 176 U/L — AB (ref 14–54)
AST: 46 U/L — AB (ref 15–41)
Albumin: 3.6 g/dL (ref 3.5–5.0)
Alkaline Phosphatase: 143 U/L — ABNORMAL HIGH (ref 38–126)
Anion gap: 8 (ref 5–15)
BUN: 9 mg/dL (ref 6–20)
CALCIUM: 8.7 mg/dL — AB (ref 8.9–10.3)
CHLORIDE: 106 mmol/L (ref 101–111)
CO2: 23 mmol/L (ref 22–32)
CREATININE: 1.02 mg/dL — AB (ref 0.44–1.00)
GFR calc non Af Amer: >60 mL/min (ref >60)
Glucose, Bld: 86 mg/dL (ref 65–99)
Potassium: 3.8 mmol/L (ref 3.5–5.1)
SODIUM: 137 mmol/L (ref 135–145)
Total Bilirubin: 1.7 mg/dL — ABNORMAL HIGH (ref 0.3–1.2)
Total Protein: 7 g/dL (ref 6.5–8.1)

## 2017-06-20 LAB — LIPASE, BLOOD: Lipase: 42 U/L (ref 11–51)

## 2017-06-20 LAB — PREGNANCY, URINE: PREG TEST UR: NEGATIVE

## 2017-06-20 SURGERY — LAPAROSCOPIC CHOLECYSTECTOMY WITH INTRAOPERATIVE CHOLANGIOGRAM
Anesthesia: General | Site: Abdomen | Wound class: Clean Contaminated

## 2017-06-20 MED ORDER — ONDANSETRON 4 MG PO TBDP: 4.0000 mg | ORAL_TABLET | Freq: Four times a day (QID) | ORAL | Status: DC | PRN

## 2017-06-20 MED ORDER — MIDAZOLAM HCL 2 MG/2ML IJ SOLN
INTRAMUSCULAR | Status: DC | PRN
  Administered 2017-06-20: 2 mg via INTRAVENOUS

## 2017-06-20 MED ORDER — KCL IN DEXTROSE-NACL 20-5-0.45 MEQ/L-%-% IV SOLN
INTRAVENOUS | Status: DC
  Administered 2017-06-20 – 2017-06-21 (×2): via INTRAVENOUS
  Filled 2017-06-20 (×5): qty 1000

## 2017-06-20 MED ORDER — MORPHINE SULFATE (PF) 2 MG/ML IV SOLN: 2.0000 mg | INTRAVENOUS | Status: DC | PRN

## 2017-06-20 MED ORDER — KETOROLAC TROMETHAMINE 30 MG/ML IJ SOLN
INTRAMUSCULAR | Status: AC
  Filled 2017-06-20: qty 1

## 2017-06-20 MED ORDER — MIDAZOLAM HCL 2 MG/2ML IJ SOLN
INTRAMUSCULAR | Status: AC
  Filled 2017-06-20: qty 2

## 2017-06-20 MED ORDER — LIDOCAINE HCL 1 % IJ SOLN
INTRAMUSCULAR | Status: DC | PRN
  Administered 2017-06-20: 20 mL

## 2017-06-20 MED ORDER — FENTANYL CITRATE (PF) 100 MCG/2ML IJ SOLN
25.0000 ug | INTRAMUSCULAR | Status: DC | PRN
  Administered 2017-06-20 (×2): 25 ug via INTRAVENOUS

## 2017-06-20 MED ORDER — DEXAMETHASONE SODIUM PHOSPHATE 10 MG/ML IJ SOLN
INTRAMUSCULAR | Status: AC
  Filled 2017-06-20: qty 1

## 2017-06-20 MED ORDER — DEXAMETHASONE SODIUM PHOSPHATE 10 MG/ML IJ SOLN
INTRAMUSCULAR | Status: DC | PRN
  Administered 2017-06-20: 10 mg via INTRAVENOUS

## 2017-06-20 MED ORDER — FENTANYL CITRATE (PF) 100 MCG/2ML IJ SOLN
INTRAMUSCULAR | Status: AC
  Filled 2017-06-20: qty 2

## 2017-06-20 MED ORDER — ACETAMINOPHEN 10 MG/ML IV SOLN
INTRAVENOUS | Status: DC | PRN
  Administered 2017-06-20: 1000 mg via INTRAVENOUS

## 2017-06-20 MED ORDER — PROPOFOL 10 MG/ML IV BOLUS
INTRAVENOUS | Status: AC
  Filled 2017-06-20: qty 20

## 2017-06-20 MED ORDER — ONDANSETRON HCL 4 MG/2ML IJ SOLN
INTRAMUSCULAR | Status: AC
  Filled 2017-06-20: qty 2

## 2017-06-20 MED ORDER — OXYCODONE-ACETAMINOPHEN 5-325 MG PO TABS
1.0000 | ORAL_TABLET | ORAL | Status: DC | PRN
  Administered 2017-06-20: 1 via ORAL
  Filled 2017-06-20: qty 1

## 2017-06-20 MED ORDER — SUGAMMADEX SODIUM 200 MG/2ML IV SOLN
INTRAVENOUS | Status: AC
Start: 2017-06-20 — End: ?
  Filled 2017-06-20: qty 2

## 2017-06-20 MED ORDER — ACETAMINOPHEN NICU IV SYRINGE 10 MG/ML
INTRAVENOUS | Status: AC
  Filled 2017-06-20: qty 1

## 2017-06-20 MED ORDER — ENOXAPARIN SODIUM 40 MG/0.4ML ~~LOC~~ SOLN
40.0000 mg | SUBCUTANEOUS | Status: DC
  Filled 2017-06-20: qty 0.4

## 2017-06-20 MED ORDER — LIDOCAINE HCL (PF) 1 % IJ SOLN
INTRAMUSCULAR | Status: AC
  Filled 2017-06-20: qty 30

## 2017-06-20 MED ORDER — ROCURONIUM BROMIDE 50 MG/5ML IV SOLN
INTRAVENOUS | Status: AC
  Filled 2017-06-20: qty 1

## 2017-06-20 MED ORDER — OXYCODONE-ACETAMINOPHEN 5-325 MG PO TABS: 1.0000 | ORAL_TABLET | ORAL | 0 refills | Status: DC | PRN

## 2017-06-20 MED ORDER — BUPIVACAINE HCL (PF) 0.5 % IJ SOLN
INTRAMUSCULAR | Status: AC
  Filled 2017-06-20: qty 30

## 2017-06-20 MED ORDER — PROMETHAZINE HCL 25 MG/ML IJ SOLN: 6.2500 mg | INTRAMUSCULAR | Status: DC | PRN

## 2017-06-20 MED ORDER — KETOROLAC TROMETHAMINE 30 MG/ML IJ SOLN
INTRAMUSCULAR | Status: DC | PRN
  Administered 2017-06-20: 30 mg via INTRAVENOUS

## 2017-06-20 MED ORDER — ROCURONIUM BROMIDE 100 MG/10ML IV SOLN
INTRAVENOUS | Status: DC | PRN
Start: 2017-06-20 — End: 2017-06-20
  Administered 2017-06-20: 30 mg via INTRAVENOUS
  Administered 2017-06-20: 20 mg via INTRAVENOUS

## 2017-06-20 MED ORDER — FENTANYL CITRATE (PF) 100 MCG/2ML IJ SOLN
INTRAMUSCULAR | Status: AC
  Administered 2017-06-20: 25 ug via INTRAVENOUS
  Filled 2017-06-20: qty 2

## 2017-06-20 MED ORDER — PROPOFOL 10 MG/ML IV BOLUS
INTRAVENOUS | Status: DC | PRN
Start: 2017-06-20 — End: 2017-06-20
  Administered 2017-06-20: 130 mg via INTRAVENOUS

## 2017-06-20 MED ORDER — SUGAMMADEX SODIUM 200 MG/2ML IV SOLN
INTRAVENOUS | Status: DC | PRN
  Administered 2017-06-20: 170 mg via INTRAVENOUS

## 2017-06-20 MED ORDER — KETOROLAC TROMETHAMINE 30 MG/ML IJ SOLN
30.0000 mg | Freq: Four times a day (QID) | INTRAMUSCULAR | Status: DC
  Administered 2017-06-20 – 2017-06-21 (×2): 30 mg via INTRAVENOUS
  Filled 2017-06-20 (×2): qty 1

## 2017-06-20 MED ORDER — ONDANSETRON HCL 4 MG/2ML IJ SOLN: 4.0000 mg | Freq: Four times a day (QID) | INTRAMUSCULAR | Status: DC | PRN

## 2017-06-20 MED ORDER — PHENYLEPHRINE HCL 10 MG/ML IJ SOLN
INTRAMUSCULAR | Status: DC | PRN
  Administered 2017-06-20 (×2): 50 ug via INTRAVENOUS

## 2017-06-20 MED ORDER — FENTANYL CITRATE (PF) 100 MCG/2ML IJ SOLN
INTRAMUSCULAR | Status: DC | PRN
  Administered 2017-06-20: 100 ug via INTRAVENOUS
  Administered 2017-06-20 (×2): 50 ug via INTRAVENOUS

## 2017-06-20 MED ORDER — ONDANSETRON HCL 4 MG/2ML IJ SOLN
INTRAMUSCULAR | Status: DC | PRN
  Administered 2017-06-20: 4 mg via INTRAVENOUS

## 2017-06-20 MED ORDER — LACTATED RINGERS IV SOLN
INTRAVENOUS | Status: DC | PRN
  Administered 2017-06-20: 12:00:00 via INTRAVENOUS

## 2017-06-20 SURGICAL SUPPLY — 38 items
APPLIER CLIP ROT 10 11.4 M/L (STAPLE) ×3
CATH REDDICK CHOLANGI 4FR 50CM (CATHETERS) ×3 IMPLANT
CHLORAPREP W/TINT 26ML (MISCELLANEOUS) ×3 IMPLANT
CLIP APPLIE ROT 10 11.4 M/L (STAPLE) ×1 IMPLANT
DECANTER SPIKE VIAL GLASS SM (MISCELLANEOUS) IMPLANT
DERMABOND ADVANCED (GAUZE/BANDAGES/DRESSINGS) ×2
DERMABOND ADVANCED .7 DNX12 (GAUZE/BANDAGES/DRESSINGS) ×1 IMPLANT
DRAPE SHEET LG 3/4 BI-LAMINATE (DRAPES) ×6 IMPLANT
DRESSING SURGICEL FIBRLLR 1X2 (HEMOSTASIS) IMPLANT
DRSG SURGICEL FIBRILLAR 1X2 (HEMOSTASIS)
ELECT REM PT RETURN 9FT ADLT (ELECTROSURGICAL) ×3
ELECTRODE REM PT RTRN 9FT ADLT (ELECTROSURGICAL) ×1 IMPLANT
GLOVE BIO SURGEON STRL SZ7 (GLOVE) ×3 IMPLANT
GLOVE BIO SURGEON STRL SZ7.5 (GLOVE) ×6 IMPLANT
GLOVE BIOGEL PI IND STRL 7.5 (GLOVE) ×1 IMPLANT
GLOVE BIOGEL PI INDICATOR 7.5 (GLOVE) ×2
GOWN STRL REUS W/ TWL LRG LVL3 (GOWN DISPOSABLE) ×3 IMPLANT
GOWN STRL REUS W/TWL LRG LVL3 (GOWN DISPOSABLE) ×6
GRASPER SUT TROCAR 14GX15 (MISCELLANEOUS) ×3 IMPLANT
IRRIGATION STRYKERFLOW (MISCELLANEOUS) IMPLANT
IRRIGATOR STRYKERFLOW (MISCELLANEOUS)
IV NS 1000ML (IV SOLUTION)
IV NS 1000ML BAXH (IV SOLUTION) IMPLANT
KIT TURNOVER KIT A (KITS) ×3 IMPLANT
NEEDLE HYPO 22GX1.5 SAFETY (NEEDLE) ×3 IMPLANT
NEEDLE INSUFFLATION 14GA 120MM (NEEDLE) ×3 IMPLANT
NS IRRIG 1000ML POUR BTL (IV SOLUTION) ×3 IMPLANT
PACK LAP CHOLECYSTECTOMY (MISCELLANEOUS) ×3 IMPLANT
POUCH SPECIMEN RETRIEVAL 10MM (ENDOMECHANICALS) ×3 IMPLANT
SCISSORS METZENBAUM CVD 33 (INSTRUMENTS) ×3 IMPLANT
SLEEVE ENDOPATH XCEL 5M (ENDOMECHANICALS) ×6 IMPLANT
SUT MNCRL AB 4-0 PS2 18 (SUTURE) ×3 IMPLANT
SUT VICRYL 0 UR6 27IN ABS (SUTURE) ×3 IMPLANT
SUT VICRYL AB 3-0 FS1 BRD 27IN (SUTURE) ×3 IMPLANT
SYR 20CC LL (SYRINGE) IMPLANT
TROCAR XCEL NON-BLD 11X100MML (ENDOMECHANICALS) ×3 IMPLANT
TROCAR XCEL NON-BLD 5MMX100MML (ENDOMECHANICALS) ×3 IMPLANT
TUBING INSUFFLATION (TUBING) ×3 IMPLANT

## 2017-06-20 NOTE — Progress Notes (Signed)
Patient ID: Tami Murphy, female   DOB: Jan 19, 1995, 22 y.o.   MRN: [id number]  [facility] PROGRESS NOTE  Tami Murphy WUJ:[id number] DOB: 08-Mar-1995 DOA: 06/18/2017 PCP: Patient, No Pcp Per  HPI/Subjective: Patient has no further abdominal pain.  Feels okay.  As per nursing staff surgery scheduled for 9 AM.  Objective: Vitals:   06/19/17 2052 06/20/17 0453  BP: 110/72 122/66  Pulse: (!) 49 (!) 58  Resp: 18 18  Temp: 97.6 F (36.4 C) 98.1 F (36.7 C)  SpO2: 100% 100%    Filed Weights   06/18/17 0329 06/18/17 0330  Weight: 84.4 kg (186 lb) 84.4 kg (186 lb)    ROS: Review of Systems  Constitutional: Negative for chills and fever.  Eyes: Negative for blurred vision.  Respiratory: Negative for cough and shortness of breath.   Cardiovascular: Negative for chest pain.  Gastrointestinal: Negative for abdominal pain, constipation, diarrhea, nausea and vomiting.  Genitourinary: Negative for dysuria.  Musculoskeletal: Negative for joint pain.  Neurological: Negative for dizziness and headaches.   Exam: Physical Exam  Constitutional: She is oriented to person, place, and time.  HENT:  Nose: No mucosal edema.  Mouth/Throat: No oropharyngeal exudate or posterior oropharyngeal edema.  Eyes: Pupils are equal, round, and reactive to light. Conjunctivae, EOM and lids are normal.  Neck: No JVD present. Carotid bruit is not present. No edema present. No thyroid mass and no thyromegaly present.  Cardiovascular: S1 normal and S2 normal. Exam reveals no gallop.  No murmur heard. Pulses:      Dorsalis pedis pulses are 2+ on the right side, and 2+ on the left side.  Respiratory: No respiratory distress. She has no wheezes. She has no rhonchi. She has no rales.  GI: Soft. Bowel sounds are normal. There is no tenderness.  Musculoskeletal:       Right ankle: She exhibits no swelling.       Left ankle: She exhibits no swelling.  Lymphadenopathy:    She has no cervical adenopathy.   Neurological: She is alert and oriented to person, place, and time. No cranial nerve deficit.  Skin: Skin is warm. No rash noted. Nails show no clubbing.  Psychiatric: She has a normal mood and affect.      Data Reviewed: Basic Metabolic Panel: Recent Labs  Lab 06/15/17 1150 06/18/17 0419 06/19/17 0416 06/20/17 0445  NA 138 136 136 137  K 4.0 3.9 3.7 3.8  CL 107 103 108 106  CO2 25 24 21* 23  GLUCOSE 95 108* 78 86  BUN 12 10 13 9   CREATININE 0.98 0.88 1.01* 1.02*  CALCIUM 8.7* 9.0 8.4* 8.7*   Liver Function Tests: Recent Labs  Lab 06/15/17 1150 06/18/17 0419 06/19/17 0416 06/20/17 0445  AST 17 421* 121* 46*  ALT 19 398* 255* 176*  ALKPHOS 65 182* 168* 143*  BILITOT 0.9 4.4* 2.5* 1.7*  PROT 7.2 7.8 6.6 7.0  ALBUMIN 3.9 3.9 3.5 3.6   Recent Labs  Lab 06/15/17 1150 06/18/17 0419 06/19/17 0416 06/20/17 0445  LIPASE 23 733* 122* 42   CBC: Recent Labs  Lab 06/15/17 1150 06/18/17 0419 06/19/17 0416 06/20/17 0445  WBC 7.0 5.1 4.9 4.6  NEUTROABS  --   --   --  2.5  HGB 12.9 12.9 12.1 12.5  HCT 40.2 38.7 36.1 37.8  MCV 80.2 79.8* 80.0 79.7*  PLT 217 193 181 193      Studies: Mr 3d Recon At Scanner  Result Date: 06/18/2017 CLINICAL DATA:  Abdominal pain, vomiting, gallstones EXAM: MRI ABDOMEN WITHOUT AND WITH CONTRAST (INCLUDING MRCP) TECHNIQUE: Multiplanar multisequence MR imaging of the abdomen was performed both before and after the administration of intravenous contrast. Heavily T2-weighted images of the biliary and pancreatic ducts were obtained, and three-dimensional MRCP images were rendered by post processing. CONTRAST:  17mL MULTIHANCE GADOBENATE DIMEGLUMINE 529 MG/ML IV SOLN COMPARISON:  Right upper quadrant ultrasound dated 06/18/2017 FINDINGS: Lower chest: Lung bases are clear. Hepatobiliary: Liver is within normal limits. Mild periportal edema. Small layering gallstones. No convincing gallbladder wall thickening. Mild pericholecystic fluid. No  gallbladder distention. Overall, this is considered equivocal, although mild acute cholecystitis is suspected. No intrahepatic or extrahepatic ductal dilatation. No choledocholithiasis is seen. Pancreas: Mild peripancreatic fluid along the pancreatic head/uncinate process, raising the possibility of mild acute pancreatitis, possibly secondary to the gallbladder findings. No peripancreatic fluid collection, atrophy, or ductal dilatation. Spleen:  Within normal limits. Adrenals/Urinary Tract:  Adrenal glands are within normal limits. Kidneys are within normal limits.  No hydronephrosis. Stomach/Bowel: Stomach is within normal limits. Visualized bowel is unremarkable. Vascular/Lymphatic:  No evidence of abdominal aortic aneurysm. No suspicious abdominal lymphadenopathy. Other:  No abdominal ascites. Musculoskeletal: No focal osseous lesions. IMPRESSION: Inflammatory changes/fluid in the right upper quadrant, as described above. Primary differential considerations include acute cholecystitis and/or pancreatitis. The pancreatic inflammation may be secondary to cholecystitis, or technically vice versa. Cholelithiasis. Mild periportal edema. No intrahepatic or extrahepatic ductal dilatation. No choledocholithiasis is seen. No peripancreatic fluid collection/pseudocyst. Electronically Signed   By: Charline BillsSriyesh  Krishnan M.D.   On: 06/18/2017 10:50   Mr Abdomen Mrcp W Wo Contast  Result Date: 06/18/2017 CLINICAL DATA:  Abdominal pain, vomiting, gallstones EXAM: MRI ABDOMEN WITHOUT AND WITH CONTRAST (INCLUDING MRCP) TECHNIQUE: Multiplanar multisequence MR imaging of the abdomen was performed both before and after the administration of intravenous contrast. Heavily T2-weighted images of the biliary and pancreatic ducts were obtained, and three-dimensional MRCP images were rendered by post processing. CONTRAST:  17mL MULTIHANCE GADOBENATE DIMEGLUMINE 529 MG/ML IV SOLN COMPARISON:  Right upper quadrant ultrasound dated 06/18/2017  FINDINGS: Lower chest: Lung bases are clear. Hepatobiliary: Liver is within normal limits. Mild periportal edema. Small layering gallstones. No convincing gallbladder wall thickening. Mild pericholecystic fluid. No gallbladder distention. Overall, this is considered equivocal, although mild acute cholecystitis is suspected. No intrahepatic or extrahepatic ductal dilatation. No choledocholithiasis is seen. Pancreas: Mild peripancreatic fluid along the pancreatic head/uncinate process, raising the possibility of mild acute pancreatitis, possibly secondary to the gallbladder findings. No peripancreatic fluid collection, atrophy, or ductal dilatation. Spleen:  Within normal limits. Adrenals/Urinary Tract:  Adrenal glands are within normal limits. Kidneys are within normal limits.  No hydronephrosis. Stomach/Bowel: Stomach is within normal limits. Visualized bowel is unremarkable. Vascular/Lymphatic:  No evidence of abdominal aortic aneurysm. No suspicious abdominal lymphadenopathy. Other:  No abdominal ascites. Musculoskeletal: No focal osseous lesions. IMPRESSION: Inflammatory changes/fluid in the right upper quadrant, as described above. Primary differential considerations include acute cholecystitis and/or pancreatitis. The pancreatic inflammation may be secondary to cholecystitis, or technically vice versa. Cholelithiasis. Mild periportal edema. No intrahepatic or extrahepatic ductal dilatation. No choledocholithiasis is seen. No peripancreatic fluid collection/pseudocyst. Electronically Signed   By: Charline BillsSriyesh  Krishnan M.D.   On: 06/18/2017 10:50    Scheduled Meds: . enoxaparin (LOVENOX) injection  40 mg Subcutaneous Q24H   Continuous Infusions: . sodium chloride    . lactated ringers 100 mL/hr at 06/20/17 0337  . piperacillin-tazobactam (ZOSYN)  IV 3.375 g (06/20/17 0539)    Assessment/Plan:  1. Preoperative consultation for laparoscopic cholecystectomy.  Lipase normalized.  No contraindications to  surgery at this time.  Call if everything goes smoothly with the surgery likely discharge tomorrow.   2. Acute gallstone pancreatitis with acute cholecystitis, elevated liver function test and bilirubin.  Continue IV fluid hydration.  MRCP negative for gallstone in the ducts.  Lipase normalized.  Liver function test and bilirubin trending better.  Empiric antibiotics.  Hopefully can stop antibiotics soon after cholecystectomy.  Code Status:     Code Status Orders  (From admission, onward)        Start     Ordered   06/18/17 0751  Full code  Continuous     06/18/17 0751    Code Status History    Date Active Date Inactive Code Status Order ID Comments User Context   04/22/2017 2101 04/24/2017 1919 Full Code [id number]  Hurshel Party CNM Inpatient   04/21/2017 1327 04/22/2017 2101 Full Code [id number]  Pincus Large, DO Inpatient     Disposition Plan: Potentially home tomorrow if everything goes smoothly today with laparoscopic cholecystectomy.  Consultants:  Gastroenterology  General surgery  Antibiotics:  Zosyn  Time spent: 27 minutes  Izaya Netherton [employer]

## 2017-06-20 NOTE — Progress Notes (Signed)
Patient ID: Tami ColletElexus Purington, female   DOB: 1995-09-10, 22 y.o.   MRN: [id number]  Case discussed with Dr. Earlene Plateravis general surgery.  He will take over care and discharge the patient tomorrow.  Medical service will sign off.  Dr. Alford Highlandichard Damarious [place]cl[place]

## 2017-06-20 NOTE — Op Note (Signed)
SURGICAL OPERATIVE REPORT   DATE OF PROCEDURE: 06/20/2017  ATTENDING Surgeon(s): Ancil Linseyavis, Brie Eppard Evan, MD  ANESTHESIA: GETA  PRE-OPERATIVE DIAGNOSIS: Gallstone Pancreatitis (K85.10)  POST-OPERATIVE DIAGNOSIS: Gallstone Pancreatitis (K85.10)  PROCEDURE(S): (cpt's: 47563) 1.) Laparoscopic Cholecystectomy 2.) Intra-operative Cholangiogram  INTRAOPERATIVE FINDINGS: Moderate pericholecystic inflammation with cystic duct and cystic artery clips well-secured, patent cystic, common, and hepatic bile ducts without visualization of filling defect, appropriate drainage of biliary contrast into duodenum, hemostasis at completion of procedure  INTRAOPERATIVE FLUIDS: 700 mL crystalloid   ESTIMATED BLOOD LOSS: Minimal (<30 mL)   URINE OUTPUT: No foley  SPECIMENS: Gallbladder  IMPLANTS: None  DRAINS: None   COMPLICATIONS: None apparent   CONDITION AT COMPLETION: Hemodynamically stable and extubated  DISPOSITION: PACU   INDICATION(S) FOR PROCEDURE:  Patient is a 22 y.o. female who this admission presented 2 months post-partum with abdominal pain. Ultrasound and labs suggested gallstone pancreatitis with mild cholecystitis. MRCP was performed, which demonstrated no evidence of persistent choledocholithiasis. All risks, benefits, and alternatives to cholecystectomy with intra-operative cholangiogram were discussed with the patient, who elected to proceed, and informed consent was accordingly obtained at that time.   DETAILS OF PROCEDURE:  Patient was brought to the operating suite and appropriately identified. General anesthesia was administered along with peri-operative prophylactic IV antibiotics, and endotracheal intubation was performed by anesthesiologist, along with NG/OG tube for gastric decompression. In supine position, operative site was prepped and draped in usual sterile fashion, and following a brief time out, initial 5 mm incision was made in a natural skin crease just above her  umbilicus (between her umbilicus and supra-umbilical piercing considering both supra- and infra- umbilical piercings). Fascia was then elevated, and a Verress needle was inserted and its proper position confirmed using aspiration and saline meniscus test.  Upon insufflation of the abdominal cavity with carbon dioxide to a well-tolerated pressure of 12-15 mmHg, 5 mm peri-umbilical port followed by laparoscope were inserted and used to inspect the abdominal cavity and its contents with no injuries from insertion of the first trochar noted. Three additional trocars were inserted, one at the epigastric position (10 mm) and two along the Right costal margin (5 mm). The table was then placed in reverse Trendelenburg position with the Right side up. Filmy adhesions between the gallbladder and omentum/duodenum/transverse colon were lysed using combined blunt dissection and selective electrocautery. The apex/dome of the gallbladder was grasped with an atraumatic grasper passed through the lateral port and retracted apically over the liver. The infundibulum was also grasped and retracted, exposing Calot's triangle. The peritoneum overlying the gallbladder infundibulum was incised and dissected free of surrounding peritoneal attachments, revealing the cystic duct and a small cystic artery. A single clip was placed across the specimen side of the cystic duct, and a cystic ductotomy was then made using endoshear scissors. Cholangiocatheter was inserted into the cystic duct, through which cholangiogram was performed, revealing patent cystic, common, and hepatic bile ducts with no evidence of any ductal filling defect and drainage of biliary contrast into duodenum. Cholangiocatheter was then removed, and cystic duct and artery were clipped twice on the patient side and once on the gallbladder specimen side close to the gallbladder. The gallbladder was then dissected from its peritoneal attachments to the liver using  electrocautery, and the gallbladder was placed into a laparoscopic specimen bag and removed from the abdominal cavity via the epigastric port site. Hemostasis and secure placement of clips were confirmed, and intra-peritoneal cavity was inspected with no additional findings. PMI laparoscopic fascial closure device  was then used to re-approximate fascia at the 10 mm epigastric port site.  All ports were then removed under direct visualization, and abdominal cavity was desuflated. All port sites were irrigated/cleaned, additional local anesthetic was injected at each incision, 3-0 Vicryl was used to re-approximate dermis at 10 mm port site(s), and subcuticular 4-0 Monocryl suture was used to re-approximate skin. Skin was then cleaned, dried, and sterile skin glue was applied. Patient was then safely able to be awakened, extubated, and transferred to PACU for post-operative monitoring and care.   I was present for all aspects of the above procedure, and no operative complications were apparent.

## 2017-06-20 NOTE — Anesthesia Postprocedure Evaluation (Signed)
Anesthesia Post Note  Patient: Tami Murphy  Procedure(s) Performed: LAPAROSCOPIC CHOLECYSTECTOMY WITH INTRAOPERATIVE CHOLANGIOGRAM (N/A Abdomen)  Patient location during evaluation: PACU Anesthesia Type: General Level of consciousness: awake and alert Pain management: pain level controlled Vital Signs Assessment: post-procedure vital signs reviewed and stable Respiratory status: spontaneous breathing, nonlabored ventilation, respiratory function stable and patient connected to nasal cannula oxygen Cardiovascular status: blood pressure returned to baseline and stable Postop Assessment: no apparent nausea or vomiting Anesthetic complications: no     Last Vitals:  Vitals:   06/20/17 1421 06/20/17 1450  BP: 124/74 134/84  Pulse:  73  Resp: 13 18  Temp: 36.9 C   SpO2:  100%    Last Pain:  Vitals:   06/20/17 1421  TempSrc:   PainSc: Asleep                 Lenard SimmerAndrew Torren Maffeo

## 2017-06-20 NOTE — Progress Notes (Signed)
Pharmacy Antibiotic Note  Tami Murphy is a 22 y.o. female admitted on 06/18/2017 with Intra abdominal infection.  Pharmacy has been consulted for Zosyn dosing.  Plan: Continue zosyn 3.375g q 8 hr (4 hr infusion). Pt is for laparoscopic cholecystectomy this AM.   Height: 5\' 5"  (165.1 cm) Weight: 186 lb (84.4 kg) IBW/kg (Calculated) : 57  Temp (24hrs), Avg:97.8 F (36.6 C), Min:97.6 F (36.4 C), Max:98.1 F (36.7 C)  Recent Labs  Lab 06/15/17 1150 06/18/17 0419 06/19/17 0416 06/20/17 0445  WBC 7.0 5.1 4.9 4.6  CREATININE 0.98 0.88 1.01* 1.02*    Estimated Creatinine Clearance: 93.7 mL/min (A) (by C-G formula based on SCr of 1.02 mg/dL (H)).    Allergies  Allergen Reactions  . Bactrim [Sulfamethoxazole-Trimethoprim] Hives    Antimicrobials this admission: Zosyn 6/12 >>   Thank you for allowing pharmacy to be a part of this patient's care.  Olene FlossMelissa D Malyna Budney, Pharm.D, BCPS Clinical Pharmacist  06/20/2017 10:09 AM

## 2017-06-20 NOTE — Anesthesia Preprocedure Evaluation (Signed)
Anesthesia Evaluation  Patient identified by MRN, date of birth, ID band Patient awake    Reviewed: Allergy & Precautions, H&P , NPO status , Patient's Chart, lab work & pertinent test results, reviewed documented beta blocker date and time   Airway Mallampati: II  TM Distance: >3 FB Neck ROM: full    Dental  (+) Dental Advidsory Given, Teeth Intact   Pulmonary neg pulmonary ROS,           Cardiovascular Exercise Tolerance: Good negative cardio ROS       Neuro/Psych negative neurological ROS  negative psych ROS   GI/Hepatic negative GI ROS, Neg liver ROS,   Endo/Other  negative endocrine ROS  Renal/GU negative Renal ROS  negative genitourinary   Musculoskeletal   Abdominal   Peds  Hematology negative hematology ROS (+)   Anesthesia Other Findings Past Medical History: No date: Pregnancy induced hypertension   Reproductive/Obstetrics negative OB ROS                             Anesthesia Physical Anesthesia Plan  ASA: I  Anesthesia Plan: General   Post-op Pain Management:    Induction: Intravenous  PONV Risk Score and Plan: 3 and Ondansetron and Dexamethasone  Airway Management Planned: Oral ETT  Additional Equipment:   Intra-op Plan:   Post-operative Plan: Extubation in OR  Informed Consent: I have reviewed the patients History and Physical, chart, labs and discussed the procedure including the risks, benefits and alternatives for the proposed anesthesia with the patient or authorized representative who has indicated his/her understanding and acceptance.   Dental Advisory Given  Plan Discussed with: Anesthesiologist, CRNA and Surgeon  Anesthesia Plan Comments:         Anesthesia Quick Evaluation

## 2017-06-20 NOTE — Transfer of Care (Signed)
Immediate Anesthesia Transfer of Care Note  Patient: Tami Murphy  Procedure(s) Performed: LAPAROSCOPIC CHOLECYSTECTOMY WITH INTRAOPERATIVE CHOLANGIOGRAM (N/A Abdomen)  Patient Location: PACU  Anesthesia Type:General  Level of Consciousness: drowsy and patient cooperative  Airway & Oxygen Therapy: Patient Spontanous Breathing and Patient connected to face mask oxygen  Post-op Assessment: Report given to RN and Post -op Vital signs reviewed and stable  Post vital signs: Reviewed and stable  Last Vitals:  Vitals Value Taken Time  BP 134/84 06/20/2017  1:51 PM  Temp    Pulse 85 06/20/2017  1:53 PM  Resp 16 06/20/2017  1:53 PM  SpO2 100 % 06/20/2017  1:53 PM  Vitals shown include unvalidated device data.  Last Pain:  Vitals:   06/20/17 0918  TempSrc: Oral  PainSc:          Complications: No apparent anesthesia complications

## 2017-06-20 NOTE — Anesthesia Post-op Follow-up Note (Signed)
Anesthesia QCDR form completed.        

## 2017-06-20 NOTE — Anesthesia Procedure Notes (Signed)
Procedure Name: Intubation Date/Time: 06/20/2017 12:00 PM Performed by: Jonna Alban, CRNA Pre-anesthesia Checklist: Patient identified, Patient being monitored, Timeout performed, Emergency Drugs available and Suction available Patient Re-evaluated:Patient Re-evaluated prior to induction Oxygen Delivery Method: Circle system utilized Preoxygenation: Pre-oxygenation with 100% oxygen Induction Type: IV induction Ventilation: Mask ventilation without difficulty Laryngoscope Size: Mac and 3 Grade View: Grade I Tube type: Oral Tube size: 7.0 mm Number of attempts: 1 Placement Confirmation: ETT inserted through vocal cords under direct vision,  positive ETCO2 and breath sounds checked- equal and bilateral Secured at: 21 cm Tube secured with: Tape Dental Injury: Teeth and Oropharynx as per pre-operative assessment

## 2017-06-21 ENCOUNTER — Encounter: Payer: Self-pay | Admitting: Surgery

## 2017-06-21 LAB — COMPREHENSIVE METABOLIC PANEL
ALBUMIN: 3.9 g/dL (ref 3.5–5.0)
ALK PHOS: 124 U/L (ref 38–126)
ALT: 138 U/L — ABNORMAL HIGH (ref 14–54)
ANION GAP: 8 (ref 5–15)
AST: 51 U/L — ABNORMAL HIGH (ref 15–41)
BUN: 6 mg/dL (ref 6–20)
CALCIUM: 8.2 mg/dL — AB (ref 8.9–10.3)
CO2: 21 mmol/L — AB (ref 22–32)
Chloride: 107 mmol/L (ref 101–111)
Creatinine, Ser: 0.92 mg/dL (ref 0.44–1.00)
GFR calc Af Amer: >60 mL/min (ref >60)
GFR calc non Af Amer: >60 mL/min (ref >60)
GLUCOSE: 120 mg/dL — AB (ref 65–99)
Potassium: 3.8 mmol/L (ref 3.5–5.1)
SODIUM: 136 mmol/L (ref 135–145)
Total Bilirubin: 1 mg/dL (ref 0.3–1.2)
Total Protein: 7.3 g/dL (ref 6.5–8.1)

## 2017-06-21 NOTE — Progress Notes (Signed)
Dr. Earlene Plateravis rounded and ordered discharge home. Discharge instructions, prescriptions and instructions for follow up appointment was reviewed with patient. Questions were encouraged and answered. A female visitor was present at the bedside and will be transporting her home. Will call for a wheelchair when she is ready.

## 2017-06-24 LAB — SURGICAL PATHOLOGY

## 2017-07-05 NOTE — Discharge Summary (Signed)
Physician Discharge Summary  Patient ID: Franco Colletlexus Upadhyay MRN: [id number] DOB/AGE: October 01, 1995 22 y.o.  Admit date: 06/18/2017 Discharge date: 06/21/2017  Admission Diagnoses:  Discharge Diagnoses:  Active Problems:   Gallstone pancreatitis   Discharged Condition: good  Hospital Course: 22 y.o. female presented 2 months post-partum to [facility] ED for abdominal pain. Workup was found to be significant for imaging and labs demonstrating gallstone pancreatitis with mild cholecystitis. Informed consent was obtained and documented, and patient underwent laparoscopic cholecystectomy with intra-operative cholngiogram Earlene Plater(Joal Eakle, 06/20/2017).  Post-operatively, patient's pain improved/resolved and advancement of patient's diet and ambulation were well-tolerated. The remainder of patient's hospital course was essentially unremarkable, and discharge planning was initiated accordingly with patient safely able to be discharged home with appropriate discharge instructions, pain control, and outpatient surgical follow-up after all of her questions were answered to her expressed satisfaction.  Consults: GI  Treatments: surgery: laparoscopic cholecystectomy with intra-operative cholangiogram Earlene Plater(Osualdo Hansell, 06/20/2017)  Discharge Exam: Blood pressure (!) 110/59, pulse 70, temperature 98.9 F (37.2 C), temperature source Oral, resp. rate 16, height 5\' 5"  (1.651 m), weight 186 lb (84.4 kg), last menstrual period 07/29/2016, SpO2 100 %, unknown if currently breastfeeding. General appearance: alert, cooperative and no distress GI: abdomen soft and non-distended with mild peri-incisional tenderness to palpation, incisions well-approximated without any surrounding erythema and drainage  Disposition:    Allergies as of 06/21/2017      Reactions   Bactrim [sulfamethoxazole-trimethoprim] Hives      Medication List    TAKE these medications   DEPO-PROVERA IM Inject 1 each into the muscle every 3 (three) months.   ondansetron  4 MG disintegrating tablet Commonly known as:  ZOFRAN ODT Take 1 tablet (4 mg total) by mouth every 8 (eight) hours as needed for nausea or vomiting.   oxyCODONE-acetaminophen 5-325 MG tablet Commonly known as:  PERCOCET/ROXICET Take 1-2 tablets by mouth every 4 (four) hours as needed for severe pain.      Follow-up Information    Ancil Linseyavis, Roberta Angell Evan, MD. Go in 2 weeks.   Specialty:  General Surgery Why:  Call office on Monday 06/23/17 Contact information: [address] [place] [address] [phone]           Signed: Ancil LinseyJason Evan Aurelius Gildersleeve 07/05/2017, 11:53 PM

## 2017-07-09 ENCOUNTER — Encounter: Payer: Self-pay | Admitting: Surgery

## 2017-07-22 ENCOUNTER — Ambulatory Visit (INDEPENDENT_AMBULATORY_CARE_PROVIDER_SITE_OTHER): Payer: Medicaid Other | Admitting: Surgery

## 2017-07-22 ENCOUNTER — Encounter: Payer: Self-pay | Admitting: Surgery

## 2017-07-22 VITALS — BP 121/77 | HR 84 | Temp 97.8°F | Ht 65.0 in | Wt 188.6 lb

## 2017-07-22 DIAGNOSIS — K851 Biliary acute pancreatitis without necrosis or infection: Secondary | ICD-10-CM

## 2017-07-22 DIAGNOSIS — Z4889 Encounter for other specified surgical aftercare: Secondary | ICD-10-CM

## 2017-07-22 NOTE — Patient Instructions (Signed)

## 2017-07-22 NOTE — Progress Notes (Signed)
Surgical Clinic Progress/Follow-up Note   HPI:  22 y.o. Female presents to clinic for post-op follow-up 1 month s/p laparoscopic cholecystectomy with intra-operative cholangiogram Earlene Plater(Thuy Atilano, 06/20/2017). Patient reports complete resolution of pre-operative abdominal pain and has been tolerating regular diet with +flatus and normal BM's, denies N/V, fever/chills, CP, or SOB.  Review of Systems:  Constitutional: denies fever/chills  Respiratory: denies shortness of breath, wheezing  Cardiovascular: denies chest pain, palpitations  Gastrointestinal: abdominal pain, N/V, and bowel function as per interval history Skin: Denies any other rashes or skin discolorations except post-surgical wounds as per interval history  Vital Signs:  BP 121/77   Pulse 84   Temp 97.8 F (36.6 C) (Oral)   Ht 5\' 5"  (1.651 m)   Wt 188 lb 9.6 oz (85.5 kg)   BMI 31.38 kg/m    Physical Exam:  Constitutional:  -- Normal body habitus  -- Awake, alert, and oriented x3  Pulmonary:  -- No crackles -- Equal breath sounds bilaterally -- Breathing non-labored at rest Cardiovascular:  -- S1, S2 present  -- No pericardial rubs  Gastrointestinal:  -- Soft and non-distended, non-tender to palpation, no guarding/rebound tenderness -- Post-surgical incisions all well-approximated without any peri-incisional erythema or drainage -- No abdominal masses appreciated, pulsatile or otherwise  Musculoskeletal / Integumentary:  -- Wounds or skin discoloration: None appreciated except post-surgical incisions as described above (GI) -- Extremities: B/L UE and LE FROM, hands and feet warm, no edema   Assessment:  22 y.o. yo Female with a problem list including...  Patient Active Problem List   Diagnosis Date Noted  . Gallstone pancreatitis 06/18/2017  . NSVD (normal spontaneous vaginal delivery) 04/22/2017  . Gestational hypertension 04/21/2017    presents to clinic for post-op follow-up evaluation, doing well 1 month s/p  laparoscopic cholecystectomy with intra-operative cholangiogram Earlene Plater(Isidoro Santillana, 06/20/2017) for gallstone pancreatitis.  Plan:              - advance diet as tolerated              - okay to submerge incisions under water (baths, swimming) prn             - gradually resume all activities without restrictions over next 2 weeks             - apply sunblock particularly to incisions with sun exposure to reduce pigmentation of scars             - return to clinic as needed, instructed to call office if any questions or concerns  All of the above recommendations were discussed with the patient, and all of patient's questions were answered to herexpressed satisfaction.  -- Scherrie GerlachJason E. Earlene Plateravis, MD, RPVI [place]: [facility] General Surgery - Partnering for exceptional care. Office: [phone]

## 2018-10-15 ENCOUNTER — Encounter: Payer: Self-pay | Admitting: Emergency Medicine

## 2018-10-15 ENCOUNTER — Other Ambulatory Visit: Payer: Self-pay

## 2018-10-15 ENCOUNTER — Emergency Department
Admission: EM | Admit: 2018-10-15 | Discharge: 2018-10-15 | Disposition: A | Discharge status: Home / Self Care | Payer: Medicaid Other | Attending: Emergency Medicine | Admitting: Emergency Medicine

## 2018-10-15 ENCOUNTER — Emergency Department: Payer: Medicaid Other

## 2018-10-15 DIAGNOSIS — M549 Dorsalgia, unspecified: Secondary | ICD-10-CM | POA: Insufficient documentation

## 2018-10-15 DIAGNOSIS — Z79899 Other long term (current) drug therapy: Secondary | ICD-10-CM | POA: Diagnosis not present

## 2018-10-15 DIAGNOSIS — R0789 Other chest pain: Secondary | ICD-10-CM | POA: Insufficient documentation

## 2018-10-15 DIAGNOSIS — R0602 Shortness of breath: Secondary | ICD-10-CM | POA: Insufficient documentation

## 2018-10-15 LAB — BASIC METABOLIC PANEL
Anion gap: 7 (ref 5–15)
BUN: 11 mg/dL (ref 6–20)
CO2: 22 mmol/L (ref 22–32)
Calcium: 8.9 mg/dL (ref 8.9–10.3)
Chloride: 108 mmol/L (ref 98–111)
Creatinine, Ser: 1.01 mg/dL — ABNORMAL HIGH (ref 0.44–1.00)
GFR calc Af Amer: >60 mL/min (ref >60)
GFR calc non Af Amer: >60 mL/min (ref >60)
Glucose, Bld: 112 mg/dL — ABNORMAL HIGH (ref 70–99)
Potassium: 3.6 mmol/L (ref 3.5–5.1)
Sodium: 137 mmol/L (ref 135–145)

## 2018-10-15 LAB — URINALYSIS, COMPLETE (UACMP) WITH MICROSCOPIC
Bacteria, UA: NONE SEEN
Bilirubin Urine: NEGATIVE
Glucose, UA: NEGATIVE mg/dL
Hgb urine dipstick: NEGATIVE
Ketones, ur: NEGATIVE mg/dL
Leukocytes,Ua: NEGATIVE
Nitrite: NEGATIVE
Protein, ur: NEGATIVE mg/dL
Specific Gravity, Urine: 1.019 (ref 1.005–1.030)
pH: 6 (ref 5.0–8.0)

## 2018-10-15 LAB — CBC
HCT: 41.1 % (ref 36.0–46.0)
Hemoglobin: 13.4 g/dL (ref 12.0–15.0)
MCH: 25 pg — ABNORMAL LOW (ref 26.0–34.0)
MCHC: 32.6 g/dL (ref 30.0–36.0)
MCV: 76.8 fL — ABNORMAL LOW (ref 80.0–100.0)
Platelets: 217 10*3/uL (ref 150–400)
RBC: 5.35 MIL/uL — ABNORMAL HIGH (ref 3.87–5.11)
RDW: 13.9 % (ref 11.5–15.5)
WBC: 6.5 10*3/uL (ref 4.0–10.5)
nRBC: 0 % (ref 0.0–0.2)

## 2018-10-15 LAB — TROPONIN I (HIGH SENSITIVITY)
Troponin I (High Sensitivity): <2 ng/L (ref <18)
Troponin I (High Sensitivity): <2 ng/L (ref <18)

## 2018-10-15 LAB — FIBRIN DERIVATIVES D-DIMER (ARMC ONLY): Fibrin derivatives D-dimer (ARMC): 214.53 ng/mL (FEU) (ref 0.00–499.00)

## 2018-10-15 LAB — POCT PREGNANCY, URINE: Preg Test, Ur: NEGATIVE

## 2018-10-15 MED ORDER — KETOROLAC TROMETHAMINE 10 MG PO TABS: 10.0000 mg | ORAL_TABLET | Freq: Once | ORAL | Status: DC

## 2018-10-15 MED ORDER — KETOROLAC TROMETHAMINE 30 MG/ML IJ SOLN
15.0000 mg | INTRAMUSCULAR | Status: AC
  Administered 2018-10-15: 15 mg via INTRAVENOUS
  Filled 2018-10-15: qty 1

## 2018-10-15 NOTE — ED Provider Notes (Signed)
[facility] Emergency Department Provider Note  ____________________________________________  Time seen: Approximately 2:48 PM  I have reviewed the triage vital signs and the nursing notes.   HISTORY  Chief Complaint Chest Pain and Back Pain    HPI Tami Murphy is a 23 y.o. female with no significant past medical history who complains of left-sided chest pain for the past 2 weeks.  Intermittent, lasting a few minutes at a time.  Worse with deep breathing, associated shortness of breath, radiates to the back and the right arm occasionally but not consistently.  No exertional symptoms.  No cough fevers chills body aches or sick contacts.  Pain is current moderate in intensity.    Denies any recent travel trauma hospitalization or surgery, but she is on Depo-Provera, and gave birth 1 year ago which was followed by cholecystectomy 2 months later.    Past Medical History:  Diagnosis Date  . Pregnancy induced hypertension      Patient Active Problem List   Diagnosis Date Noted  . Gallstone pancreatitis 06/18/2017  . NSVD (normal spontaneous vaginal delivery) 04/22/2017  . Gestational hypertension 04/21/2017     Past Surgical History:  Procedure Laterality Date  . CHOLECYSTECTOMY N/A 06/20/2017   Procedure: LAPAROSCOPIC CHOLECYSTECTOMY WITH INTRAOPERATIVE CHOLANGIOGRAM;  Surgeon: Vickie Epley, MD;  Location: [facility] ORS;  Service: General;  Laterality: N/A;  . NO PAST SURGERIES       Prior to Admission medications   Medication Sig Start Date End Date Taking? Authorizing Provider  medroxyPROGESTERone Acetate (DEPO-PROVERA IM) Inject 1 each into the muscle every 3 (three) months.    [provider]     Allergies Bactrim [sulfamethoxazole-trimethoprim]   Family History  Problem Relation Age of Onset  . Healthy Mother   . Healthy Father   . CVA Maternal Grandfather     Social History Social History   Tobacco Use  . Smoking status:  Never Smoker  . Smokeless tobacco: Never Used  Substance Use Topics  . Alcohol use: No  . Drug use: No    Review of Systems  Constitutional:   No fever or chills.  ENT:   No sore throat. No rhinorrhea. Cardiovascular: Positive chest pain as above without syncope. Respiratory:   No dyspnea or cough. Gastrointestinal:   Negative for abdominal pain, vomiting and diarrhea.  Musculoskeletal:   Negative for focal pain or swelling All other systems reviewed and are negative except as documented above in ROS and HPI.  ____________________________________________   PHYSICAL EXAM:  VITAL SIGNS: ED Triage Vitals  Enc Vitals Group     BP 10/15/18 1108 139/84     Pulse Rate 10/15/18 1108 99     Resp 10/15/18 1108 20     Temp 10/15/18 1108 98.9 F (37.2 C)     Temp Source 10/15/18 1108 Oral     SpO2 10/15/18 1108 99 %     Weight 10/15/18 1106 225 lb (102.1 kg)     Height 10/15/18 1106 5\' 5"  (1.651 m)     Head Circumference --      Peak Flow --      Pain Score 10/15/18 1106 8     Pain Loc --      Pain Edu? --      Excl. in [place]? --     Vital signs reviewed, nursing assessments reviewed.   Constitutional:   Alert and oriented. Non-toxic appearance. Eyes:   Conjunctivae are normal. EOMI. PERRL. ENT  Head:   Normocephalic and atraumatic.      Nose:   Wearing a mask.      Mouth/Throat:   Wearing a mask.      Neck:   No meningismus. Full ROM. Hematological/Lymphatic/Immunilogical:   No cervical lymphadenopathy. Cardiovascular:   RRR. Symmetric bilateral radial and DP pulses.  No murmurs. Cap refill less than 2 seconds. Respiratory:   Normal respiratory effort without tachypnea/retractions. Breath sounds are clear and equal bilaterally. No wheezes/rales/rhonchi. Gastrointestinal:   Soft and nontender. Non distended. There is no CVA tenderness.  No rebound, rigidity, or guarding. Musculoskeletal:   Normal range of motion in all extremities. No joint effusions.  No lower extremity  tenderness.  No edema.  Mild tenderness to the left anterior chest wall reproducing pain. Neurologic:   Normal speech and language.  Motor grossly intact. No acute focal neurologic deficits are appreciated.  Skin:    Skin is warm, dry and intact. No rash noted.  No petechiae, purpura, or bullae.  ____________________________________________    LABS (pertinent positives/negatives) (all labs ordered are listed, but only abnormal results are displayed) Labs Reviewed  BASIC METABOLIC PANEL - Abnormal; Notable for the following components:      Result Value   Glucose, Bld 112 (*)    Creatinine, Ser 1.01 (*)    All other components within normal limits  CBC - Abnormal; Notable for the following components:   RBC 5.35 (*)    MCV 76.8 (*)    MCH 25.0 (*)    All other components within normal limits  URINALYSIS, COMPLETE (UACMP) WITH MICROSCOPIC - Abnormal; Notable for the following components:   Color, Urine YELLOW (*)    APPearance HAZY (*)    All other components within normal limits  FIBRIN DERIVATIVES D-DIMER ([facility] ONLY)  POC URINE PREG, ED  POCT PREGNANCY, URINE  TROPONIN I (HIGH SENSITIVITY)  TROPONIN I (HIGH SENSITIVITY)   ____________________________________________   EKG  Interpreted by me Sinus rhythm rate of 88, normal axis and intervals.  Normal QRS and ST segments.  Isolated T wave inversion in lead III which is nonspecific.  No ischemic changes, no evidence of right heart strain.  ____________________________________________    RADIOLOGY  Dg Chest 2 View  Result Date: 10/15/2018 CLINICAL DATA:  Chest pain EXAM: CHEST - 2 VIEW COMPARISON:  06/24/2016 FINDINGS: The heart size and mediastinal contours are within normal limits. Both lungs are clear. The visualized skeletal structures are unremarkable. IMPRESSION: No acute abnormality of the lungs. Electronically Signed   By: Lauralyn PrimesAlex  Bibbey M.D.   On: 10/15/2018 12:00     ____________________________________________   PROCEDURES Procedures  ____________________________________________  DIFFERENTIAL DIAGNOSIS   Chest wall pain/musculoskeletal strain, pleurisy, pneumonia, pulmonary embolism  CLINICAL IMPRESSION / ASSESSMENT AND PLAN / ED COURSE  Medications ordered in the ED: Medications  ketorolac (TORADOL) 30 MG/ML injection 15 mg (15 mg Intravenous Given 10/15/18 1327)    Pertinent labs & imaging results that were available during my care of the patient were reviewed by me and considered in my medical decision making (see chart for details).  Tami Chestine SporeClark was evaluated in Emergency Department on 10/15/2018 for the symptoms described in the history of present illness. She was evaluated in the context of the global COVID-19 pandemic, which necessitated consideration that the patient might be at risk for infection with the SARS-CoV-2 virus that causes COVID-19. Institutional protocols and algorithms that pertain to the evaluation of patients at risk for COVID-19 are in a state of  rapid change based on information released by regulatory bodies including the CDC and federal and state organizations. These policies and algorithms were followed during the patient's care in the ED.     Clinical Course as of Oct 15 1446  Thu Oct 15, 2018  1411 Patient presents with atypical chest pain.  Likely musculoskeletal in nature.  However with giving birth a year ago, Depo-Provera use, pleuritic nature of the pain, I will obtain a d-dimer for further risk stratification.  Chest x-ray EKG and labs including serial troponins are all otherwise unremarkable.  Toradol 15 mg IV for pain control.   [PS]  1428 D-dimer negative.  Stable for discharge.   [PS]    Clinical Course User Index [PS] Sharman Cheek, MD    ----------------------------------------- 2:53 PM on 10/15/2018 -----------------------------------------  Work-up negative.  Patient feeling better.   Recommend discharge home, NSAIDs, heat therapy, follow-up with primary care.Considering the patient's symptoms, medical history, and physical examination today, I have low suspicion for ACS, PE, TAD, pneumothorax, carditis, mediastinitis, pneumonia, CHF, or sepsis.  Doubt underlying dysrhythmia.   ____________________________________________   FINAL CLINICAL IMPRESSION(S) / ED DIAGNOSES    Final diagnoses:  Atypical chest pain     ED Discharge Orders    None      Portions of this note were generated with dragon dictation software. Dictation errors may occur despite best attempts at proofreading.   Sharman Cheek, MD 10/15/18 [phone]

## 2018-10-15 NOTE — ED Triage Notes (Signed)
Pt reports intermittent chest pain to the left side of her chest for 2 weeks that is sharpe in nature. Pt also reports pain to her right arm and back. Pt reports pain in her back is alternates between upper and lower.

## 2018-10-15 NOTE — ED Notes (Signed)
Pt sitting on bench when I entered the room, pt in NAD, reports CP in left side that comes and goes x 1 year but has gradually worsened, also reports back pain, thinks back pain is due to epidural done when she gave birth. A&Ox4.

## 2018-10-15 NOTE — Discharge Instructions (Signed)
Your EKG, chest x-ray, and lab results were all okay today.  Take ibuprofen or naproxen for pain control, and use a heating pad intermittently to help with the pain.

## 2019-04-04 ENCOUNTER — Other Ambulatory Visit: Payer: Self-pay

## 2019-04-04 ENCOUNTER — Emergency Department: Payer: Medicaid Other

## 2019-04-04 ENCOUNTER — Emergency Department
Admission: EM | Admit: 2019-04-04 | Discharge: 2019-04-04 | Disposition: A | Discharge status: Home / Self Care | Payer: Medicaid Other | Attending: Emergency Medicine | Admitting: Emergency Medicine

## 2019-04-04 DIAGNOSIS — R0789 Other chest pain: Secondary | ICD-10-CM | POA: Diagnosis not present

## 2019-04-04 DIAGNOSIS — G43909 Migraine, unspecified, not intractable, without status migrainosus: Secondary | ICD-10-CM | POA: Diagnosis not present

## 2019-04-04 DIAGNOSIS — R079 Chest pain, unspecified: Secondary | ICD-10-CM

## 2019-04-04 DIAGNOSIS — G43819 Other migraine, intractable, without status migrainosus: Secondary | ICD-10-CM

## 2019-04-04 LAB — URINALYSIS, COMPLETE (UACMP) WITH MICROSCOPIC
Bacteria, UA: NONE SEEN
Bilirubin Urine: NEGATIVE
Glucose, UA: NEGATIVE mg/dL
Hgb urine dipstick: NEGATIVE
Ketones, ur: NEGATIVE mg/dL
Leukocytes,Ua: NEGATIVE
Nitrite: NEGATIVE
Protein, ur: NEGATIVE mg/dL
Specific Gravity, Urine: 1.013 (ref 1.005–1.030)
pH: 7 (ref 5.0–8.0)

## 2019-04-04 LAB — HEPATIC FUNCTION PANEL
ALT: 16 U/L (ref 0–44)
AST: 16 U/L (ref 15–41)
Albumin: 4.3 g/dL (ref 3.5–5.0)
Alkaline Phosphatase: 74 U/L (ref 38–126)
Bilirubin, Direct: 0.1 mg/dL (ref 0.0–0.2)
Indirect Bilirubin: 0.8 mg/dL (ref 0.3–0.9)
Total Bilirubin: 0.9 mg/dL (ref 0.3–1.2)
Total Protein: 7.6 g/dL (ref 6.5–8.1)

## 2019-04-04 LAB — CBC
HCT: 41.3 % (ref 36.0–46.0)
Hemoglobin: 13.6 g/dL (ref 12.0–15.0)
MCH: 26.2 pg (ref 26.0–34.0)
MCHC: 32.9 g/dL (ref 30.0–36.0)
MCV: 79.6 fL — ABNORMAL LOW (ref 80.0–100.0)
Platelets: 190 10*3/uL (ref 150–400)
RBC: 5.19 MIL/uL — ABNORMAL HIGH (ref 3.87–5.11)
RDW: 13.9 % (ref 11.5–15.5)
WBC: 5.9 10*3/uL (ref 4.0–10.5)
nRBC: 0 % (ref 0.0–0.2)

## 2019-04-04 LAB — BASIC METABOLIC PANEL
Anion gap: 7 (ref 5–15)
BUN: 10 mg/dL (ref 6–20)
CO2: 24 mmol/L (ref 22–32)
Calcium: 8.8 mg/dL — ABNORMAL LOW (ref 8.9–10.3)
Chloride: 106 mmol/L (ref 98–111)
Creatinine, Ser: 1.06 mg/dL — ABNORMAL HIGH (ref 0.44–1.00)
GFR calc Af Amer: >60 mL/min (ref >60)
GFR calc non Af Amer: >60 mL/min (ref >60)
Glucose, Bld: 95 mg/dL (ref 70–99)
Potassium: 4.1 mmol/L (ref 3.5–5.1)
Sodium: 137 mmol/L (ref 135–145)

## 2019-04-04 LAB — POCT PREGNANCY, URINE: Preg Test, Ur: NEGATIVE

## 2019-04-04 LAB — TROPONIN I (HIGH SENSITIVITY): Troponin I (High Sensitivity): <2 ng/L (ref <18)

## 2019-04-04 LAB — LIPASE, BLOOD: Lipase: 19 U/L (ref 11–51)

## 2019-04-04 MED ORDER — ACETAMINOPHEN 500 MG PO TABS
1000.0000 mg | ORAL_TABLET | Freq: Once | ORAL | Status: AC
  Administered 2019-04-04: 1000 mg via ORAL
  Filled 2019-04-04: qty 2

## 2019-04-04 MED ORDER — KETOROLAC TROMETHAMINE 30 MG/ML IJ SOLN
15.0000 mg | Freq: Once | INTRAMUSCULAR | Status: AC
  Administered 2019-04-04: 15 mg via INTRAVENOUS
  Filled 2019-04-04: qty 1

## 2019-04-04 NOTE — ED Notes (Signed)
Pt verbalized understanding of discharge instructions. NAD at this time. 

## 2019-04-04 NOTE — ED Triage Notes (Signed)
Pt arrived via POV with reports of HA off and on all week, states 2 nights ago getting in the shower she noticed a sharp CP that caused difficulty breathing and her to feel lightheaded, pt states the HA came back this morning and continues to c/o some chest pain as well.  Pt describes the pain in chest today as a tightness and HA pain as a little sharp burning pain 5/10.

## 2019-04-04 NOTE — ED Notes (Signed)
First Nurse Note: Pt to ED via POV c/o headaches and chest pain. Pt states that she is not currently having chest pain. Had an episode a few nights ago and wants to get checked out. Pt is in NAD.

## 2019-04-04 NOTE — ED Provider Notes (Signed)
[facility] Emergency Department Provider Note  ____________________________________________   First MD Initiated Contact with Patient 04/04/19 1020     (approximate)  I have reviewed the triage vital signs and the nursing notes.   HISTORY  Chief Complaint Chest Pain and Headache     HPI Tami Murphy is a 25 y.o. female who is otherwise healthy who comes in for headaches and chest pain.  Patient came in for chest pain on 10/2018 had negative D-dimer.  Patient states that the headache started on Thursday or Friday.  The headache was gradually worsening over the course of the day and then she went to bed and woke up and the headache was still there.  She is taken ibuprofen without relief of the headache.  The headache got better on its own and is currently only a mild of the top of her head.  She denies any family history of brain bleeds.  Does report a history of migraines in the past.  Never had any prior imaging done.  No vision changes, no fevers.  She states that she also had some chest discomfort that was a sharp stabbing sensation when she was in the shower on Thursday or Friday and she felt like she was going to pass out because she was really warm.  She did not lose consciousness, did not fall.  She states that she feels like she is going to pass out very easily and has had this happen before from feeling like she needed to eat.  Currently she really does not have any chest discomfort.           Past Medical History:  Diagnosis Date  . Pregnancy induced hypertension     Patient Active Problem List   Diagnosis Date Noted  . Gallstone pancreatitis 06/18/2017  . NSVD (normal spontaneous vaginal delivery) 04/22/2017  . Gestational hypertension 04/21/2017    Past Surgical History:  Procedure Laterality Date  . CHOLECYSTECTOMY N/A 06/20/2017   Procedure: LAPAROSCOPIC CHOLECYSTECTOMY WITH INTRAOPERATIVE CHOLANGIOGRAM;  Surgeon: Vickie Epley,  MD;  Location: [facility] ORS;  Service: General;  Laterality: N/A;  . NO PAST SURGERIES      Prior to Admission medications   Medication Sig Start Date End Date Taking? Authorizing Provider  medroxyPROGESTERone Acetate (DEPO-PROVERA IM) Inject 1 each into the muscle every 3 (three) months.    [provider]    Allergies Bactrim [sulfamethoxazole-trimethoprim]  Family History  Problem Relation Age of Onset  . Healthy Mother   . Healthy Father   . CVA Maternal Grandfather     Social History Social History   Tobacco Use  . Smoking status: Never Smoker  . Smokeless tobacco: Never Used  Substance Use Topics  . Alcohol use: No  . Drug use: No      Review of Systems Constitutional: No fever/chills Eyes: No visual changes. ENT: No sore throat. Cardiovascular: Positive chest pain Respiratory: Denies shortness of breath. Gastrointestinal: No abdominal pain.  No nausea, no vomiting.  No diarrhea.  No constipation. Genitourinary: Negative for dysuria. Musculoskeletal: Negative for back pain. Skin: Negative for rash. Neurological: Positive headache., no focal weakness or numbness. All other ROS negative ____________________________________________   PHYSICAL EXAM:  VITAL SIGNS: ED Triage Vitals [04/04/19 1014]  Enc Vitals Group     BP 121/75     Pulse Rate 76     Resp 18     Temp 98.4 F (36.9 C)     Temp Source Oral  SpO2 100 %     Weight 209 lb (94.8 kg)     Height 5\' 5"  (1.651 m)     Head Circumference      Peak Flow      Pain Score 5     Pain Loc      Pain Edu?      Excl. in [place]?     Constitutional: Alert and oriented. Well appearing and in no acute distress. Eyes: Conjunctivae are normal. EOMI. Head: Atraumatic. Nose: No congestion/rhinnorhea. Mouth/Throat: Mucous membranes are moist.   Neck: No stridor. Trachea Midline. FROM Cardiovascular: Normal rate, regular rhythm. Grossly normal heart sounds.  Good peripheral circulation. Respiratory:  Normal respiratory effort.  No retractions. Lungs CTAB. Gastrointestinal: Soft and nontender. No distention. No abdominal bruits.  Musculoskeletal: No lower extremity tenderness nor edema.  No joint effusions. Neurologic:  Normal speech and language. No gross focal neurologic deficits are appreciated.  Cranial nerves II through XII are intact. Skin:  Skin is warm, dry and intact. No rash noted. Psychiatric: Mood and affect are normal. Speech and behavior are normal. GU: Deferred   ____________________________________________   LABS (all labs ordered are listed, but only abnormal results are displayed)  Labs Reviewed  BASIC METABOLIC PANEL - Abnormal; Notable for the following components:      Result Value   Creatinine, Ser 1.06 (*)    Calcium 8.8 (*)    All other components within normal limits  CBC - Abnormal; Notable for the following components:   RBC 5.19 (*)    MCV 79.6 (*)    All other components within normal limits  URINALYSIS, COMPLETE (UACMP) WITH MICROSCOPIC - Abnormal; Notable for the following components:   Color, Urine YELLOW (*)    APPearance CLEAR (*)    All other components within normal limits  HEPATIC FUNCTION PANEL  LIPASE, BLOOD  POC URINE PREG, ED  POCT PREGNANCY, URINE  TROPONIN I (HIGH SENSITIVITY)   ____________________________________________   ED ECG REPORT I, , the attending physician, personally viewed and interpreted this ECG.  EKG is normal sinus rate of 73, no ST elevation, T wave inversion in lead III, normal intervals.  T wave inversion in lead III is similar ____________________________________________  RADIOLOGY Concha Se, personally viewed and evaluated these images (plain radiographs) as part of my medical decision making, as well as reviewing the written report by the radiologist.  ED MD interpretation: No pneumonia noted  Official radiology report(s): DG Chest 2 View  Result Date: 04/04/2019 CLINICAL DATA:   Intermittent chest pain for 1 week. EXAM: CHEST - 2 VIEW COMPARISON:  October 15, 2018 FINDINGS: The heart size and mediastinal contours are within normal limits. Both lungs are clear. The visualized skeletal structures are unremarkable. IMPRESSION: No active cardiopulmonary disease. Electronically Signed   By: October 17, 2018 M.D.   On: 04/04/2019 10:47    ____________________________________________   PROCEDURES  Procedure(s) performed (including Critical Care):  Procedures   ____________________________________________   INITIAL IMPRESSION / ASSESSMENT AND PLAN / ED COURSE   Keyandra Gaba was evaluated in Emergency Department on 04/04/2019 for the symptoms described in the history of present illness. She was evaluated in the context of the global COVID-19 pandemic, which necessitated consideration that the patient might be at risk for infection with the SARS-CoV-2 virus that causes COVID-19. Institutional protocols and algorithms that pertain to the evaluation of patients at risk for COVID-19 are in a state of rapid change based on information released by  regulatory bodies including the CDC and federal and state organizations. These policies and algorithms were followed during the patient's care in the ED.    Most Likely DDx:  -MSK (atypical chest pain) but will get cardiac markers to evaluate for ACS given risk factors/age -For patient's headache it seems like it was gradual in onset and there does not seem to be any red flag symptoms to suggest subarachnoid, pseudotumor, meningitis.  We will give some symptomatic treatment with Tylenol and Toradol and reassess.  We discussed CT imaging but given no red flag symptoms she is agreeable to holding off.    DDx that was also considered d/t potential to cause harm, but was found less likely based on history and physical (as detailed above): -PNA (no fevers, cough but CXR to evaluate) -PNX (reassured with equal b/l breath sounds, CXR to  evaluate) -Symptomatic anemia (will get H&H) -Pulmonary embolism as no sob at rest, not pleuritic in nature, no hypoxia, PERC negative -Aortic Dissection as no tearing pain and no radiation to the mid back, pulses equal -Pericarditis no rub on exam, EKG changes or hx to suggest dx -Tamponade (no notable SOB, tachycardic, hypotensive) -Esophageal rupture (no h/o diffuse vomitting/no crepitus)   Patient given Tylenol and Toradol  Reevaluated patient and she states that she is feeling better.  Pregnancy test was negative.  Patient's kidney function is around her baseline at 1.06  No white count elevation to suggest infection, troponin was negative and her pain has been going on greater than 3 hours and chest x-ray was negative.  Patient feels comfortable with discharge home and we discussed using Tylenol ibuprofen for pain.  I discussed the provisional nature of ED diagnosis, the treatment so far, the ongoing plan of care, follow up appointments and return precautions with the patient and any family or support people present. They expressed understanding and agreed with the plan, discharged home.          ____________________________________________   FINAL CLINICAL IMPRESSION(S) / ED DIAGNOSES   Final diagnoses:  Nonspecific chest pain  Other migraine without status migrainosus, intractable     MEDICATIONS GIVEN DURING THIS VISIT:  Medications  acetaminophen (TYLENOL) tablet 1,000 mg (1,000 mg Oral Given 04/04/19 1104)  ketorolac (TORADOL) 30 MG/ML injection 15 mg (15 mg Intravenous Given 04/04/19 1105)     ED Discharge Orders    None       Note:  This document was prepared using Dragon voice recognition software and may include unintentional dictation errors.   Concha Se, MD 04/04/19 [phone]

## 2019-04-04 NOTE — Discharge Instructions (Addendum)
Your chest x-ray and heart markers were reassuring.  You can take Tylenol 1 g every 8 hours and ibuprofen 600 every 6 hours with food.  You can follow-up with your primary care doctor and return the ER if you develop shortness of breath, fevers or any other concerns.

## 2019-09-12 ENCOUNTER — Other Ambulatory Visit: Payer: Self-pay

## 2019-09-12 ENCOUNTER — Emergency Department: Payer: Medicaid Other

## 2019-09-12 ENCOUNTER — Emergency Department
Admission: EM | Admit: 2019-09-12 | Discharge: 2019-09-12 | Disposition: A | Discharge status: Home / Self Care | Payer: Medicaid Other | Attending: Emergency Medicine | Admitting: Emergency Medicine

## 2019-09-12 DIAGNOSIS — Z79899 Other long term (current) drug therapy: Secondary | ICD-10-CM | POA: Diagnosis not present

## 2019-09-12 DIAGNOSIS — R079 Chest pain, unspecified: Secondary | ICD-10-CM | POA: Diagnosis present

## 2019-09-12 DIAGNOSIS — Z20822 Contact with and (suspected) exposure to covid-19: Secondary | ICD-10-CM | POA: Diagnosis not present

## 2019-09-12 LAB — BASIC METABOLIC PANEL
Anion gap: 6 (ref 5–15)
BUN: 8 mg/dL (ref 6–20)
CO2: 27 mmol/L (ref 22–32)
Calcium: 8.9 mg/dL (ref 8.9–10.3)
Chloride: 106 mmol/L (ref 98–111)
Creatinine, Ser: 0.9 mg/dL (ref 0.44–1.00)
GFR calc Af Amer: >60 mL/min (ref >60)
GFR calc non Af Amer: >60 mL/min (ref >60)
Glucose, Bld: 65 mg/dL — ABNORMAL LOW (ref 70–99)
Potassium: 3.5 mmol/L (ref 3.5–5.1)
Sodium: 139 mmol/L (ref 135–145)

## 2019-09-12 LAB — TROPONIN I (HIGH SENSITIVITY): Troponin I (High Sensitivity): <2 ng/L (ref <18)

## 2019-09-12 LAB — CBC
HCT: 39.5 % (ref 36.0–46.0)
Hemoglobin: 13.2 g/dL (ref 12.0–15.0)
MCH: 27 pg (ref 26.0–34.0)
MCHC: 33.4 g/dL (ref 30.0–36.0)
MCV: 80.8 fL (ref 80.0–100.0)
Platelets: 194 10*3/uL (ref 150–400)
RBC: 4.89 MIL/uL (ref 3.87–5.11)
RDW: 13.3 % (ref 11.5–15.5)
WBC: 6.1 10*3/uL (ref 4.0–10.5)
nRBC: 0 % (ref 0.0–0.2)

## 2019-09-12 LAB — SARS CORONAVIRUS 2 BY RT PCR (HOSPITAL ORDER, PERFORMED IN ~~LOC~~ HOSPITAL LAB): SARS Coronavirus 2: NEGATIVE

## 2019-09-12 MED ORDER — MELOXICAM 7.5 MG PO TABS
15.0000 mg | ORAL_TABLET | Freq: Once | ORAL | Status: AC
  Administered 2019-09-12: 15 mg via ORAL
  Filled 2019-09-12: qty 2

## 2019-09-12 MED ORDER — MELOXICAM 15 MG PO TABS: 15.0000 mg | ORAL_TABLET | Freq: Every day | ORAL | 0 refills | Status: DC

## 2019-09-12 NOTE — ED Provider Notes (Signed)
[facility] Emergency Department Provider Note  ____________________________________________  Time seen: Approximately 3:48 PM  I have reviewed the triage vital signs and the nursing notes.   HISTORY  Chief Complaint Chest Pain    HPI Tami Murphy is a 24 y.o. female who presents the emergency department complaining of left-sided chest pain x2 days.  Patient states that it comes and goes.  Does not seem to be exacerbated by movement or any other activity.  Patient has no shortness of breath.  No fevers or chills, nasal congestion, sore throat, coughing.  No cardiac history though patient has been seen multiple times for nonspecific chest pain.  Patient denies any recent trauma.  No abdominal pain, nausea vomiting, diarrhea or constipation.  Patient states that pain seems to be a pressure-like sensation on the left ribs when it occurs.  No pleuritic pain.  No hemoptysis.  Patient states that it does seem to alleviate with Motrin.  Patient is requesting Covid testing as several of her friends tested positive this past week.  She is been around them but has no symptoms currently.  She is just requesting a test "so I know."         Past Medical History:  Diagnosis Date  . Pregnancy induced hypertension     Patient Active Problem List   Diagnosis Date Noted  . Gallstone pancreatitis 06/18/2017  . NSVD (normal spontaneous vaginal delivery) 04/22/2017  . Gestational hypertension 04/21/2017    Past Surgical History:  Procedure Laterality Date  . CHOLECYSTECTOMY N/A 06/20/2017   Procedure: LAPAROSCOPIC CHOLECYSTECTOMY WITH INTRAOPERATIVE CHOLANGIOGRAM;  Surgeon: Ancil Linseyavis, Jason Evan, MD;  Location: [facility] ORS;  Service: General;  Laterality: N/A;  . NO PAST SURGERIES      Prior to Admission medications   Medication Sig Start Date End Date Taking? Authorizing Provider  medroxyPROGESTERone Acetate (DEPO-PROVERA IM) Inject 1 each into the muscle every 3 (three)  months.    [provider]  meloxicam (MOBIC) 15 MG tablet Take 1 tablet (15 mg total) by mouth daily. 09/12/19   Vanna Sailer, Delorise RoyalsJonathan D, PA-C    Allergies Bactrim [sulfamethoxazole-trimethoprim]  Family History  Problem Relation Age of Onset  . Healthy Mother   . Healthy Father   . CVA Maternal Grandfather     Social History Social History   Tobacco Use  . Smoking status: Never Smoker  . Smokeless tobacco: Never Used  Substance Use Topics  . Alcohol use: No  . Drug use: No     Review of Systems  Constitutional: No fever/chills Eyes: No visual changes. No discharge ENT: No upper respiratory complaints. Cardiovascular: Intermittent left-sided chest pain. Respiratory: no cough. No SOB. Gastrointestinal: No abdominal pain.  No nausea, no vomiting.  No diarrhea.  No constipation. Musculoskeletal: Negative for musculoskeletal pain. Skin: Negative for rash, abrasions, lacerations, ecchymosis. Neurological: Negative for headaches, focal weakness or numbness. 10-point ROS otherwise negative.  ____________________________________________   PHYSICAL EXAM:  VITAL SIGNS: ED Triage Vitals  Enc Vitals Group     BP 09/12/19 1221 134/73     Pulse Rate 09/12/19 1221 71     Resp 09/12/19 1221 17     Temp 09/12/19 1221 98.2 F (36.8 C)     Temp Source 09/12/19 1221 Oral     SpO2 09/12/19 1221 100 %     Weight 09/12/19 1222 198 lb (89.8 kg)     Height 09/12/19 1222 5\' 5"  (1.651 m)     Head Circumference --  Peak Flow --      Pain Score 09/12/19 1222 4     Pain Loc --      Pain Edu? --      Excl. in GC? --      Constitutional: Alert and oriented. Well appearing and in no acute distress. Eyes: Conjunctivae are normal. PERRL. EOMI. Head: Atraumatic. ENT:      Ears:       Nose: No congestion/rhinnorhea.      Mouth/Throat: Mucous membranes are moist.  Neck: No stridor.    Cardiovascular: Normal rate, regular rhythm. Normal S1 and S2.  Good peripheral  circulation. Respiratory: Normal respiratory effort without tachypnea or retractions. Lungs CTAB. Good air entry to the bases with no decreased or absent breath sounds. Gastrointestinal: Bowel sounds 4 quadrants. Soft and nontender to palpation. No guarding or rigidity. No palpable masses. No distention. No CVA tenderness. Musculoskeletal: Full range of motion to all extremities. No gross deformities appreciated.  No visible external trauma to the left chest wall.  No abrasions, lacerations, ecchymosis.  No paradoxical chest wall movement.  Patient is tender to palpation along the left sternal border.  No other significant tenderness to palpation.  Palpation reproduces the patient's pain. Neurologic:  Normal speech and language. No gross focal neurologic deficits are appreciated.  Skin:  Skin is warm, dry and intact. No rash noted. Psychiatric: Mood and affect are normal. Speech and behavior are normal. Patient exhibits appropriate insight and judgement.   ____________________________________________   LABS (all labs ordered are listed, but only abnormal results are displayed)  Labs Reviewed  BASIC METABOLIC PANEL - Abnormal; Notable for the following components:      Result Value   Glucose, Bld 65 (*)    All other components within normal limits  SARS CORONAVIRUS 2 BY RT PCR (HOSPITAL ORDER, PERFORMED IN [facility])  CBC  POC URINE PREG, ED  TROPONIN I (HIGH SENSITIVITY)   ____________________________________________  EKG  ED ECG REPORT I, Delorise Royals Lynze Reddy,  personally viewed and interpreted this ECG.   Date: 09/12/2019  EKG Time: 1218 hrs.  Rate: 73 bpm  Rhythm: unchanged from previous tracings, normal sinus rhythm, sinus arrhythmia  Axis: Normal axis  Intervals:none  ST&T Change: No ST elevation or depression noted  Normal sinus rhythm with sinus arrhythmia.  No STEMI.  Unchanged from previous  EKG. ____________________________________________  RADIOLOGY I personally viewed and evaluated these images as part of my medical decision making, as well as reviewing the written report by the radiologist.  DG Chest 2 View  Result Date: 09/12/2019 CLINICAL DATA:  Substernal chest pain, tightness and shortness of breath since yesterday. EXAM: CHEST - 2 VIEW COMPARISON:  04/04/2019 FINDINGS: The heart size and mediastinal contours are within normal limits. Both lungs are clear. No pleural effusion or pneumothorax. The visualized skeletal structures are unremarkable. IMPRESSION: Normal chest radiographs. Electronically Signed   By: Amie Portland M.D.   On: 09/12/2019 13:05    ____________________________________________    PROCEDURES  Procedure(s) performed:    Procedures    Medications  meloxicam (MOBIC) tablet 15 mg (has no administration in time range)       Pulmonary Embolism Rule-out Criteria (PERC rule)                        If YES to ANY of the following, the PERC rule is not satisfied and cannot be used to rule out PE in this patient (consider d-dimer  or imaging depending on pre-test probability).                      If NO to ALL of the following, AND the clinician's pre-test probability is <15%, the [facility] rule is satisfied and there is no need for further workup (including no need to obtain a d-dimer) as the post-test probability of pulmonary embolism is <2%.                      Mnemonic is HAD CLOTS   H - hormone use (exogenous estrogen)      No. A - age > 50                                                 No. D - DVT/PE history                                      No.   C - coughing blood (hemoptysis)                 No. L - leg swelling, unilateral                             No. O - O2 Sat on Room Air < 95%                  No. T - tachycardia (HR ? 100)                         No. S - surgery or trauma, recent                      No.   Based on my  evaluation of the patient, including application of this decision instrument, further testing to evaluate for pulmonary embolism is not indicated at this time. I have discussed this recommendation with the patient who states understanding and agreement with this plan.    ____________________________________________   INITIAL IMPRESSION / ASSESSMENT AND PLAN / ED COURSE  Pertinent labs & imaging results that were available during my care of the patient were reviewed by me and considered in my medical decision making (see chart for details).  Review of the [place] CSRS was performed in accordance of the [facility] prior to dispensing any controlled drugs.           Patient's diagnosis is consistent with nonspecific chest pain, likely costochondritis.  Patient presented to emergency department 2-day history of chest pain.  Patient states that nothing alleviated or worsened her pain.  Intermittent in nature.  No recent trauma.  No URI symptoms.  Exam is reassuring.  Labs are reassuring.  EKG is reassuring.  Patient is PERC negative.  No other concerning signs and symptoms.  Pain is reproducible with palpation of the left sternal border.  At this time patient be placed on anti-inflammatory for symptom relief as this seems to be improved with over-the-counter anti-inflammatory at home.  Patient requested to be Covid tested which we will will perform but patient has no other symptoms.  Patient will follow results in my chart after discharge.  Differential included  ACS/STEMI, PE, anxiety, costochondritis, GERD, bronchitis, pneumonia, COVID-19.  Follow-up with primary care as needed.  Patient is given ED precautions to return to the ED for any worsening or new symptoms.     ____________________________________________  FINAL CLINICAL IMPRESSION(S) / ED DIAGNOSES  Final diagnoses:  Nonspecific chest pain      NEW MEDICATIONS STARTED DURING THIS VISIT:  ED Discharge Orders         Ordered    meloxicam  (MOBIC) 15 MG tablet  Daily        09/12/19 1614              This chart was dictated using voice recognition software/Dragon. Despite best efforts to proofread, errors can occur which can change the meaning. Any change was purely unintentional.    Racheal Patches, PA-C 09/12/19 1614    Delton Prairie, MD 09/12/19 [phone]

## 2019-09-12 NOTE — ED Triage Notes (Signed)
PT c/o substernal chest pain/tightness with SOB since yesterday. Pt is in NAD, respirations WNL.

## 2019-11-15 ENCOUNTER — Emergency Department: Payer: No Typology Code available for payment source

## 2019-11-15 ENCOUNTER — Emergency Department
Admission: EM | Admit: 2019-11-15 | Discharge: 2019-11-15 | Disposition: A | Discharge status: Home / Self Care | Payer: No Typology Code available for payment source | Attending: Emergency Medicine | Admitting: Emergency Medicine

## 2019-11-15 ENCOUNTER — Other Ambulatory Visit: Payer: Self-pay

## 2019-11-15 ENCOUNTER — Encounter: Payer: Self-pay | Admitting: *Deleted

## 2019-11-15 DIAGNOSIS — S39012A Strain of muscle, fascia and tendon of lower back, initial encounter: Secondary | ICD-10-CM

## 2019-11-15 DIAGNOSIS — S161XXA Strain of muscle, fascia and tendon at neck level, initial encounter: Secondary | ICD-10-CM | POA: Insufficient documentation

## 2019-11-15 DIAGNOSIS — R519 Headache, unspecified: Secondary | ICD-10-CM | POA: Diagnosis not present

## 2019-11-15 DIAGNOSIS — H9202 Otalgia, left ear: Secondary | ICD-10-CM | POA: Diagnosis not present

## 2019-11-15 DIAGNOSIS — O139 Gestational [pregnancy-induced] hypertension without significant proteinuria, unspecified trimester: Secondary | ICD-10-CM | POA: Insufficient documentation

## 2019-11-15 DIAGNOSIS — M79652 Pain in left thigh: Secondary | ICD-10-CM | POA: Insufficient documentation

## 2019-11-15 DIAGNOSIS — R55 Syncope and collapse: Secondary | ICD-10-CM | POA: Diagnosis not present

## 2019-11-15 DIAGNOSIS — M545 Low back pain, unspecified: Secondary | ICD-10-CM | POA: Diagnosis present

## 2019-11-15 LAB — POC URINE PREG, ED: Preg Test, Ur: NEGATIVE

## 2019-11-15 MED ORDER — MELOXICAM 15 MG PO TABS: 15.0000 mg | ORAL_TABLET | Freq: Every day | ORAL | 0 refills | Status: AC

## 2019-11-15 MED ORDER — METHOCARBAMOL 500 MG PO TABS
750.0000 mg | ORAL_TABLET | Freq: Once | ORAL | Status: AC
  Administered 2019-11-15: 750 mg via ORAL
  Filled 2019-11-15: qty 2

## 2019-11-15 MED ORDER — MELOXICAM 7.5 MG PO TABS
15.0000 mg | ORAL_TABLET | Freq: Once | ORAL | Status: AC
  Administered 2019-11-15: 15 mg via ORAL
  Filled 2019-11-15: qty 2

## 2019-11-15 MED ORDER — METHOCARBAMOL 750 MG PO TABS: 750.0000 mg | ORAL_TABLET | Freq: Four times a day (QID) | ORAL | 0 refills | Status: AC | PRN

## 2019-11-15 MED ORDER — ACETAMINOPHEN 325 MG PO TABS
650.0000 mg | ORAL_TABLET | Freq: Once | ORAL | Status: AC
  Administered 2019-11-15: 650 mg via ORAL
  Filled 2019-11-15: qty 2

## 2019-11-15 NOTE — Discharge Instructions (Signed)
Please use your medications as prescribed.  Please do not drive or operate heavy machinery with the Robaxin.  You may also use Tylenol in addition to these, up to 1000 mg 4 times daily.  Please follow-up with primary care should you have any continuing back pain or return to the emergency department for any acute worsening.
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
## 2020-01-05 ENCOUNTER — Ambulatory Visit
Admission: EM | Admit: 2020-01-05 | Discharge: 2020-01-05 | Disposition: A | Discharge status: Home / Self Care | Payer: Medicaid Other | Attending: Family Medicine | Admitting: Family Medicine

## 2020-01-05 ENCOUNTER — Encounter: Payer: Self-pay | Admitting: Emergency Medicine

## 2020-01-05 ENCOUNTER — Other Ambulatory Visit: Payer: Self-pay

## 2020-01-05 DIAGNOSIS — U071 COVID-19: Secondary | ICD-10-CM | POA: Insufficient documentation

## 2020-01-05 LAB — RESP PANEL BY RT-PCR (FLU A&B, COVID) ARPGX2
Influenza A by PCR: NEGATIVE
Influenza B by PCR: NEGATIVE
SARS Coronavirus 2 by RT PCR: POSITIVE — AB

## 2020-01-05 MED ORDER — NAPROXEN 500 MG PO TABS: 500.0000 mg | ORAL_TABLET | Freq: Two times a day (BID) | ORAL | 0 refills | Status: DC | PRN

## 2020-01-05 MED ORDER — BENZONATATE 200 MG PO CAPS: 200.0000 mg | ORAL_CAPSULE | Freq: Three times a day (TID) | ORAL | 0 refills | Status: DC | PRN

## 2020-01-05 NOTE — Discharge Instructions (Signed)
I have sent in medications for body aches, headache, and cough.  I have sent information to infusion clinic.   Stay home.  Take care  Dr. Adriana Simas

## 2020-01-05 NOTE — ED Provider Notes (Signed)
[facility]    CSN: [id number] Arrival date & time: 01/05/20  5631      History   Chief Complaint Chief Complaint  Patient presents with   Generalized Body Aches   Cough   Headache   HPI  24 year old female presents with multiple complaints.  Symptoms x3 days.  Reports body aches, fatigue, cough, back pain, headache.  Also reports subjective fever and has had chills as well.  Patient is concerned that she has COVID-19.  Pain 6/10 in severity.  She has taken ibuprofen without relief.  No other associated symptoms.  No other complaints.  Past Medical History:  Diagnosis Date   Pregnancy induced hypertension    Patient Active Problem List   Diagnosis Date Noted   Gallstone pancreatitis 06/18/2017   NSVD (normal spontaneous vaginal delivery) 04/22/2017   Gestational hypertension 04/21/2017    Past Surgical History:  Procedure Laterality Date   CHOLECYSTECTOMY N/A 06/20/2017   Procedure: LAPAROSCOPIC CHOLECYSTECTOMY WITH INTRAOPERATIVE CHOLANGIOGRAM;  Surgeon: Ancil Linsey, MD;  Location: [facility] ORS;  Service: General;  Laterality: N/A;    OB History    Gravida  1   Para  1   Term  1   Preterm  0   AB  0   Living  1     SAB  0   IAB  0   Ectopic  0   Multiple  0   Live Births  1            Home Medications    Prior to Admission medications   Medication Sig Start Date End Date Taking? Authorizing Provider  benzonatate (TESSALON) 200 MG capsule Take 1 capsule (200 mg total) by mouth 3 (three) times daily as needed for cough. 01/05/20  Yes Brentyn Seehafer G, DO  naproxen (NAPROSYN) 500 MG tablet Take 1 tablet (500 mg total) by mouth 2 (two) times daily as needed for moderate pain or headache. 01/05/20  Yes Tommie Sams, DO    Family History Family History  Problem Relation Age of Onset   Healthy Mother    Healthy Father    CVA Maternal Grandfather     Social History Social History   Tobacco Use   Smoking status:  Never Smoker   Smokeless tobacco: Never Used  Building services engineer Use: Never used  Substance Use Topics   Alcohol use: Yes    Comment: socially   Drug use: Yes    Frequency: 3.0 times per week    Types: Marijuana     Allergies   Bactrim [sulfamethoxazole-trimethoprim]   Review of Systems Review of Systems  Constitutional: Positive for chills, fatigue and fever.  Respiratory: Positive for cough.   Musculoskeletal: Positive for back pain.       Body aches.   Physical Exam Triage Vital Signs ED Triage Vitals [01/05/20 0920]  Enc Vitals Group     BP 117/80     Pulse Rate 95     Resp 18     Temp 98.5 F (36.9 C)     Temp Source Oral     SpO2 100 %     Weight 180 lb (81.6 kg)     Height 5\' 5"  (1.651 m)     Head Circumference      Peak Flow      Pain Score 6     Pain Loc      Pain Edu?      Excl. in GC?  Updated Vital Signs BP 117/80 (BP Location: Left Arm)    Pulse 95    Temp 98.5 F (36.9 C) (Oral)    Resp 18    Ht 5\' 5"  (1.651 m)    Wt 81.6 kg    LMP 12/30/2019 (Exact Date)    SpO2 100%    BMI 29.95 kg/m   Visual Acuity Right Eye Distance:   Left Eye Distance:   Bilateral Distance:    Right Eye Near:   Left Eye Near:    Bilateral Near:     Physical Exam Vitals and nursing note reviewed.  Constitutional:      General: She is not in acute distress.    Appearance: Normal appearance. She is not ill-appearing.  HENT:     Head: Normocephalic and atraumatic.  Eyes:     General:        Right eye: No discharge.        Left eye: No discharge.     Conjunctiva/sclera: Conjunctivae normal.  Cardiovascular:     Rate and Rhythm: Normal rate and regular rhythm.     Heart sounds: No murmur heard.   Pulmonary:     Effort: Pulmonary effort is normal.     Breath sounds: Normal breath sounds. No wheezing, rhonchi or rales.  Neurological:     Mental Status: She is alert.  Psychiatric:        Mood and Affect: Mood normal.        Behavior: Behavior normal.     UC Treatments / Results  Labs (all labs ordered are listed, but only abnormal results are displayed) Labs Reviewed  RESP PANEL BY RT-PCR (FLU A&B, COVID) ARPGX2 - Abnormal; Notable for the following components:      Result Value   SARS Coronavirus 2 by RT PCR POSITIVE (*)    All other components within normal limits    EKG   Radiology No results found.  Procedures Procedures (including critical care time)  Medications Ordered in UC Medications - No data to display  Initial Impression / Assessment and Plan / UC Course  I have reviewed the triage vital signs and the nursing notes.  Pertinent labs & imaging results that were available during my care of the patient were reviewed by me and considered in my medical decision making (see chart for details).    24 year old female presents with COVID-19.  Acute illness with systemic symptoms as patient has had subjective fever and chills.  Naproxen as directed.  Information sent to infusion clinic.  Supportive care.  Work note given.  Final Clinical Impressions(s) / UC Diagnoses   Final diagnoses:  COVID     Discharge Instructions     I have sent in medications for body aches, headache, and cough.  I have sent information to infusion clinic.   Stay home.  Take care  Dr. 30    ED Prescriptions    Medication Sig Dispense Auth. Provider   naproxen (NAPROSYN) 500 MG tablet Take 1 tablet (500 mg total) by mouth 2 (two) times daily as needed for moderate pain or headache. 30 tablet Sondra Blixt G, DO   benzonatate (TESSALON) 200 MG capsule Take 1 capsule (200 mg total) by mouth 3 (three) times daily as needed for cough. 30 capsule 03-08-1982, DO     PDMP not reviewed this encounter.   Tommie Sams, DO 01/05/20 1046

## 2020-01-05 NOTE — ED Triage Notes (Signed)
Patient in today c/o body aches, fatigue and cough x 3 days. Patient has had chills, but hasn't taken her temperature. Patient has taken Ibuprofen. Patient has not had the covid vaccines.

## 2020-11-15 ENCOUNTER — Other Ambulatory Visit: Payer: Self-pay

## 2020-11-15 ENCOUNTER — Ambulatory Visit (LOCAL_COMMUNITY_HEALTH_CENTER): Payer: Medicaid Other | Admitting: Advanced Practice Midwife

## 2020-11-15 VITALS — BP 103/68 | HR 74 | Temp 97.7°F | Resp 18 | Ht 65.0 in | Wt 198.4 lb

## 2020-11-15 DIAGNOSIS — Z3009 Encounter for other general counseling and advice on contraception: Secondary | ICD-10-CM

## 2020-11-15 DIAGNOSIS — Z01419 Encounter for gynecological examination (general) (routine) without abnormal findings: Secondary | ICD-10-CM | POA: Diagnosis not present

## 2020-11-15 DIAGNOSIS — F1290 Cannabis use, unspecified, uncomplicated: Secondary | ICD-10-CM | POA: Insufficient documentation

## 2020-11-15 DIAGNOSIS — E669 Obesity, unspecified: Secondary | ICD-10-CM | POA: Insufficient documentation

## 2020-11-15 LAB — WET PREP FOR TRICH, YEAST, CLUE
Trichomonas Exam: NEGATIVE
Yeast Exam: NEGATIVE

## 2020-11-15 NOTE — Progress Notes (Signed)
Patient here for annual exam and PAP.   Wet mount reviewed during clinic visit - no treatment indicated.   Patient aware PAP and gc/chlamydia will result in a few weeks and to check my chart and we will notify her of any abnormal results.  Floy Sabina, RN

## 2020-11-15 NOTE — Progress Notes (Signed)
[facility] DEPARTMENT [facility] [address] Main Number: [phone]    Family Planning Visit- Initial Visit  Subjective:  Tami Murphy is a 25 y.o. engaged BF vaper G1P1001 (3 yo son)  being seen today for an initial annual visit and to discuss contraceptive options.  The patient is currently using None for pregnancy prevention. Patient reports she does want a pregnancy in the next year.  Patient has the following medical conditions has Gestational hypertension; Gallstone pancreatitis; Obesity BMI=33.0; and Marijuana use on their problem list.  Chief Complaint  Patient presents with   Annual Exam    Patient reports here for physical. LMP 10/29/20. Last sex 11/13/20 without condom; with current partner x 6 years; 1 partner in last 3 mo. Last physical 2019. Working 32 hours/wk and not in school. Living with her 48 yo son. Last vaped 3 wks ago. Last MJ last night daily. Last ETOH 10/28/20 (2 daquiries) q 2 mo. Next dental exam 11/17/20. Never had pap.   Patient denies cigs, cigars  Body mass index is 33.02 kg/m. - Patient is eligible for diabetes screening based on BMI and age >58?  not applicable HA1C ordered? not applicable  Patient reports 1  partner/s in last year. Desires STI screening?  No - declines bloodwork  Has patient been screened once for HCV in the past?  No  No results found for: HCVAB  Does the patient have current drug use (including MJ), have a partner with drug use, and/or has been incarcerated since last result? Yes  If yes-- Screen for HCV through [facility]   Does the patient meet criteria for HBV testing? No  Criteria:  -Household, sexual or needle sharing contact with HBV -History of drug use -HIV positive -Those with known Hep C   Health Maintenance Due  Topic Date Due   COVID-19 Vaccine (1) Never done   HPV VACCINES (1 - 2-dose series) Never done   Hepatitis C Screening  Never done   TETANUS/TDAP  Never  done   PAP-Cervical Cytology Screening  Never done   PAP SMEAR-Modifier  Never done   CHLAMYDIA SCREENING  04/22/2018   INFLUENZA VACCINE  Never done    Review of Systems  All other systems reviewed and are negative.  The following portions of the patient's history were reviewed and updated as appropriate: allergies, current medications, past family history, past medical history, past social history, past surgical history and problem list. Problem list updated.   See flowsheet for other program required questions.  Objective:   Vitals:   11/15/20 1044  BP: 103/68  Pulse: 74  Resp: 18  Temp: 97.7 F (36.5 C)  TempSrc: Oral  Weight: 198 lb 6.4 oz (90 kg)  Height: 5\' 5"  (1.651 m)    Physical Exam Constitutional:      Appearance: Normal appearance. She is obese.  HENT:     Head: Normocephalic and atraumatic.     Mouth/Throat:     Mouth: Mucous membranes are moist.     Comments: Next dental exam is 11/17/20 Eyes:     Conjunctiva/sclera: Conjunctivae normal.  Neck:     Thyroid: No thyroid mass, thyromegaly or thyroid tenderness.  Cardiovascular:     Rate and Rhythm: Normal rate and regular rhythm.  Pulmonary:     Effort: Pulmonary effort is normal.     Breath sounds: Normal breath sounds.  Chest:  Breasts:    Right: Normal.     Left: Normal.  Abdominal:     Palpations: Abdomen is soft.     Comments: Soft without masses or tenderness, fair tone  Genitourinary:    General: Normal vulva.     Exam position: Lithotomy position.     Vagina: Vaginal discharge (white creamy leukorrhea, ph<4.5) present.     Cervix: Normal.     Uterus: Normal.      Adnexa: Right adnexa normal and left adnexa normal.     Rectum: Normal.     Comments: Pap done Musculoskeletal:        General: Normal range of motion.     Cervical back: Normal range of motion and neck supple.  Skin:    General: Skin is warm and dry.  Neurological:     Mental Status: She is alert.  Psychiatric:         Mood and Affect: Mood normal.      Assessment and Plan:  Tami Murphy is a 25 y.o. female presenting to the [facility] Department for an initial annual wellness/contraceptive visit  Contraception counseling: Reviewed all forms of birth control options in the tiered based approach. available including abstinence; over the counter/barrier methods; hormonal contraceptive medication including pill, patch, ring, injection,contraceptive implant, ECP; hormonal and nonhormonal IUDs; permanent sterilization options including vasectomy and the various tubal sterilization modalities. Risks, benefits, and typical effectiveness rates were reviewed.  Questions were answered.  Written information was also given to the patient to review.  Patient desires to conceive, this was prescribed for patient. She will follow up in  prn for surveillance.  She was told to call with any further questions, or with any concerns about this method of contraception.  Emphasized use of condoms 100% of the time for STI prevention.  Patient was offered ECP.  ECP was not accepted by the patient. ECP counseling was not given - see RN documentation  1. Obesity, unspecified classification, unspecified obesity type, unspecified whether serious comorbidity present   2. Family planning Declines birth control as wants to conceive; preconceptual counseling done Treat wet mount per standing orders Immunization nurse consult - WET PREP FOR TRICH, YEAST, CLUE - Chlamydia/Gonorrhea [facility] - Pap IG (Image Guided)  3. Well woman exam with routine gynecological exam   4. Marijuana use daily     No follow-ups on file.  No future appointments.  Tami Murphy, CNM

## 2020-11-18 LAB — PAP IG (IMAGE GUIDED): PAP Smear Comment: 0

## 2021-03-12 ENCOUNTER — Encounter: Payer: Self-pay | Admitting: Emergency Medicine

## 2021-03-12 ENCOUNTER — Emergency Department: Payer: No Typology Code available for payment source

## 2021-03-12 ENCOUNTER — Emergency Department
Admission: EM | Admit: 2021-03-12 | Discharge: 2021-03-12 | Disposition: A | Discharge status: Home / Self Care | Payer: No Typology Code available for payment source | Attending: Emergency Medicine | Admitting: Emergency Medicine

## 2021-03-12 ENCOUNTER — Other Ambulatory Visit: Payer: Self-pay

## 2021-03-12 DIAGNOSIS — S52125A Nondisplaced fracture of head of left radius, initial encounter for closed fracture: Secondary | ICD-10-CM | POA: Diagnosis not present

## 2021-03-12 DIAGNOSIS — Y92410 Unspecified street and highway as the place of occurrence of the external cause: Secondary | ICD-10-CM | POA: Insufficient documentation

## 2021-03-12 DIAGNOSIS — R410 Disorientation, unspecified: Secondary | ICD-10-CM | POA: Insufficient documentation

## 2021-03-12 DIAGNOSIS — S59912A Unspecified injury of left forearm, initial encounter: Secondary | ICD-10-CM | POA: Diagnosis present
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
## 2021-03-12 NOTE — Discharge Instructions (Addendum)
Given you do have some tenderness in your wrist and your hand we are placing you in a splint.  Please call the ortho doctor to make a follow-up appointment for repeat x-ray. ? ?IMPRESSION: ?Subtle lucency within the radial styloid again identified. Favored ?to represent a nutrient foramen. If there is point tenderness in ?this area, nondisplaced fracture is possible. ? ?You can take Tylenol 1 g every 8 hours and ibuprofen as long as you are not pregnant to help with any pain. ? ?  ?  ?

## 2021-10-02 ENCOUNTER — Ambulatory Visit (LOCAL_COMMUNITY_HEALTH_CENTER): Payer: Medicaid Other

## 2021-10-02 VITALS — BP 140/78 | Ht 65.0 in | Wt 191.5 lb

## 2021-10-02 DIAGNOSIS — Z3201 Encounter for pregnancy test, result positive: Secondary | ICD-10-CM | POA: Diagnosis not present

## 2021-10-02 LAB — PREGNANCY, URINE: Preg Test, Ur: POSITIVE — AB

## 2021-10-02 MED ORDER — PRENATAL 27-0.8 MG PO TABS: 1.0000 | ORAL_TABLET | Freq: Every day | ORAL | 0 refills | Status: AC

## 2021-10-02 NOTE — Progress Notes (Signed)
UPT positive. Unsure where she plans prenatal care. Local prenatal provider list given. Hx preeclampsia and advised to establish prenatal care soon. Sent to DSS for medicaid/ preg women.   The patient was dispensed prenatal  vitamins #100 today. I provided counseling today regarding the medication. We discussed the medication, the side effects and when to call clinic. Patient given the opportunity to ask questions. Questions answered.  Josie Saunders, RN

## 2022-01-07 NOTE — L&D Delivery Note (Signed)
Delivery Note Polette Hoiland is a G2P1001 at [redacted]w[redacted]d who had a spontaneous delivery at 0720 a viable female was delivered via LOA.  APGAR: 8, 9; weight 3450g (7lb9.7oz)  .     Admitted for HELLP vs. Preeclampsia vs. thrombocytopenia. Induction of labor due to concern for HELLP. Induced with AROM/pitocin. Progressed normally. Received epidural for pain management. Pushed for 60 minutes, labored down for an hour, then pushed for additional 60 minutes. Baby was delivered without difficulty. No nuchal cord.  Delayed cord clamping for 60 seconds.  Delivery of placenta was spontaneous. Placenta was found to be intact, 3 -vessel cord was noted. The fundus was found to be firm. Perineum intact. Estimated blood loss 150cc.Instrument and gauze counts were correct at the end of the procedure.   Placenta status:  to L&D Anesthesia:  epidural Episiotomy:  none Lacerations:  none Suture Repair: N/A Est. Blood Loss (mL):  150  Mom to postpartum.  Baby to Couplet care / Skin to Skin.  Charlett Nose 05/10/2022, 7:45 AM

## 2022-02-12 LAB — OB RESULTS CONSOLE GC/CHLAMYDIA
Chlamydia: NEGATIVE
Neisseria Gonorrhea: NEGATIVE

## 2022-02-12 LAB — OB RESULTS CONSOLE RUBELLA ANTIBODY, IGM: Rubella: IMMUNE

## 2022-02-12 LAB — OB RESULTS CONSOLE VARICELLA ZOSTER ANTIBODY, IGG: Varicella: IMMUNE

## 2022-02-12 LAB — HEPATITIS C ANTIBODY: HCV Ab: NEGATIVE

## 2022-02-12 LAB — OB RESULTS CONSOLE HEPATITIS B SURFACE ANTIGEN: Hepatitis B Surface Ag: NEGATIVE

## 2022-02-12 LAB — OB RESULTS CONSOLE RPR: RPR: NONREACTIVE

## 2022-02-19 ENCOUNTER — Ambulatory Visit
Admission: EM | Admit: 2022-02-19 | Discharge: 2022-02-19 | Disposition: A | Discharge status: Home / Self Care | Payer: Medicaid Other | Attending: Physician Assistant | Admitting: Physician Assistant

## 2022-02-19 DIAGNOSIS — Z1152 Encounter for screening for COVID-19: Secondary | ICD-10-CM | POA: Insufficient documentation

## 2022-02-20 ENCOUNTER — Ambulatory Visit
Admission: EM | Admit: 2022-02-20 | Discharge: 2022-02-20 | Disposition: A | Discharge status: Home / Self Care | Payer: Medicaid Other

## 2022-02-20 DIAGNOSIS — J069 Acute upper respiratory infection, unspecified: Secondary | ICD-10-CM

## 2022-02-20 LAB — SARS CORONAVIRUS 2 BY RT PCR: SARS Coronavirus 2 by RT PCR: NEGATIVE

## 2022-02-20 NOTE — ED Provider Notes (Signed)
[facility]    CSN: [id number] Arrival date & time: 02/20/22  0802      History   Chief Complaint Chief Complaint  Patient presents with   Covid Exposure    HPI Tami Murphy is a 27 y.o. female.   HPI  27 year old female here requesting COVID test status post COVID exposure.  The patient is currently [redacted] weeks pregnant and was exposed to COVID 4 days ago.  She has been experiencing backache for the last 3 days as well as some nasal congestion.  She had a sore throat on the first day of symptoms but that has resolved.  She states that these are symptoms that she had when she had COVID previously.  She has not had a fever, runny nose, ear pain, or cough.  Past Medical History:  Diagnosis Date   Pregnancy induced hypertension     Patient Active Problem List   Diagnosis Date Noted   Obesity BMI=33.0 11/15/2020   Marijuana use 11/15/2020   Gallstone pancreatitis 06/18/2017   Gestational hypertension 04/21/2017    Past Surgical History:  Procedure Laterality Date   CHOLECYSTECTOMY N/A 06/20/2017   Procedure: LAPAROSCOPIC CHOLECYSTECTOMY WITH INTRAOPERATIVE CHOLANGIOGRAM;  Surgeon: Vickie Epley, MD;  Location: [facility] ORS;  Service: General;  Laterality: N/A;    OB History     Gravida  2   Para  1   Term  1   Preterm  0   AB  0   Living  1      SAB  0   IAB  0   Ectopic  0   Multiple  0   Live Births  1            Home Medications    Prior to Admission medications   Not on File    Family History Family History  Problem Relation Age of Onset   Diabetes Paternal Grandfather    Hypertension Paternal Grandfather    Kidney disease Paternal Grandfather    Hypertension Paternal Grandmother    Diabetes Paternal Grandmother    Cancer Maternal Grandfather    Pancreatic cancer Maternal Grandfather    Healthy Father    Healthy Mother    Healthy Brother    Healthy Sister    CVA Maternal Great-grandfather    Alzheimer's disease  Maternal Great-grandmother     Social History Social History   Tobacco Use   Smoking status: Never    Passive exposure: Current (fiance mom is a smoker and fiance vapes)   Smokeless tobacco: Never  Vaping Use   Vaping Use: Never used  Substance Use Topics   Alcohol use: Not Currently    Alcohol/week: 2.0 standard drinks of alcohol    Types: 2 Standard drinks or equivalent per week    Comment: "I don't drink"   Drug use: Yes    Frequency: 3.0 times per week    Types: Marijuana    Comment: stopped mj 09/26/21 when found out preg     Allergies   Bactrim [sulfamethoxazole-trimethoprim]   Review of Systems Review of Systems  Constitutional:  Negative for fever.  HENT:  Positive for congestion and sore throat. Negative for ear pain and rhinorrhea.   Respiratory:  Negative for cough.   Gastrointestinal:  Negative for diarrhea, nausea and vomiting.  Musculoskeletal:  Positive for back pain.     Physical Exam Triage Vital Signs ED Triage Vitals  Enc Vitals Group     BP  Pulse      Resp      Temp      Temp src      SpO2      Weight      Height      Head Circumference      Peak Flow      Pain Score      Pain Loc      Pain Edu?      Excl. in [place]?    No data found.  Updated Vital Signs BP 123/75 (BP Location: Left Arm)   Pulse 98   Temp 98.5 F (36.9 C) (Oral)   Resp 16   Ht 5' 5"$  (1.651 m)   Wt 200 lb (90.7 kg)   LMP 08/28/2021 (Exact Date)   SpO2 98%   BMI 33.28 kg/m   Visual Acuity Right Eye Distance:   Left Eye Distance:   Bilateral Distance:    Right Eye Near:   Left Eye Near:    Bilateral Near:     Physical Exam Vitals and nursing note reviewed.  Constitutional:      Appearance: Normal appearance. She is not ill-appearing.  HENT:     Head: Normocephalic and atraumatic.     Right Ear: Tympanic membrane, ear canal and external ear normal. There is no impacted cerumen.     Left Ear: Tympanic membrane, ear canal and external ear normal.  There is no impacted cerumen.     Nose: Congestion present. No rhinorrhea.     Comments: Mucosa are erythematous and mildly edematous without any appreciable discharge.    Mouth/Throat:     Mouth: Mucous membranes are moist.     Pharynx: Oropharynx is clear. No oropharyngeal exudate or posterior oropharyngeal erythema.  Cardiovascular:     Rate and Rhythm: Normal rate and regular rhythm.     Pulses: Normal pulses.     Heart sounds: Normal heart sounds. No murmur heard.    No friction rub. No gallop.  Pulmonary:     Effort: Pulmonary effort is normal.     Breath sounds: Normal breath sounds. No wheezing, rhonchi or rales.  Musculoskeletal:     Cervical back: Normal range of motion and neck supple.  Lymphadenopathy:     Cervical: No cervical adenopathy.  Skin:    General: Skin is warm and dry.     Capillary Refill: Capillary refill takes less than 2 seconds.     Findings: No rash.  Neurological:     General: No focal deficit present.     Mental Status: She is alert and oriented to person, place, and time.      UC Treatments / Results  Labs (all labs ordered are listed, but only abnormal results are displayed) Labs Reviewed - No data to display  EKG   Radiology No results found.  Procedures Procedures (including critical care time)  Medications Ordered in UC Medications - No data to display  Initial Impression / Assessment and Plan / UC Course  I have reviewed the triage vital signs and the nursing notes.  Pertinent labs & imaging results that were available during my care of the patient were reviewed by me and considered in my medical decision making (see chart for details).   Patient is a pleasant and nontoxic-appearing 27 year old female who is [redacted] weeks pregnant presenting for evaluation status post COVID exposure.  Her exposure was 4 days ago and she developed backaches, nasal congestion, and a sore throat 3 days ago.  The  congestion and backache still remain but  the sore throat resolved.  She reports that the symptoms are very similar to the symptoms she had when she had COVID previously.  On exam she does have inflamed nasal mucosa but no discharge.  The remainder of her cardiopulmonary exam is benign.  Given her exposure I will swab her for COVID.  I have advised the patient that due to the conflicting literature and small trials that I am not comfortable prescribing antivirals.  She does have an obstetrician and I have advised her that she should touch base with her obstetrician when she is discharged, whether she is positive or not.  If her OB/GYN feels that this safe and necessary to prescribe the antivirals for COVID they may do so.  COVID PCR is negative.  I will discharge patient home with a diagnosis of viral URI.  I will advise the patient that she should mask around others as it could take 14 days for her to test positive following COVID exposure.  I have advised her to touch base with her OB/GYN to let her know that she was both exposed and tested negative today.  Work note provided.   Final Clinical Impressions(s) / UC Diagnoses   Final diagnoses:  Viral URI     Discharge Instructions      Your test for COVID today was negative.  However, this does not mean that she may not still develop COVID.  It can take up to 14 days following exposure for you to develop symptoms and have a positive test.  Use over-the-counter Tylenol according to package instructions as needed for any aches you may have.  You may use sinus irrigation and saline nasal spray to help you with nasal congestion.  I would recommend that she wear a mask around others to protect yourself and prevent possible transmission of COVID.  Call your OB/GYN and let them know that you were exposed to [place] and that you tested negative today so they may monitor you going forward.  Please return for reevaluation for any new or worsening symptoms or follow-up with your  OB/GYN.     ED Prescriptions   None    PDMP not reviewed this encounter.   Margarette Canada, NP 02/20/22 [phone]

## 2022-02-20 NOTE — Discharge Instructions (Signed)
Your test for COVID today was negative.  However, this does not mean that she may not still develop COVID.  It can take up to 14 days following exposure for you to develop symptoms and have a positive test.  Use over-the-counter Tylenol according to package instructions as needed for any aches you may have.  You may use sinus irrigation and saline nasal spray to help you with nasal congestion.  I would recommend that she wear a mask around others to protect yourself and prevent possible transmission of COVID.  Call your OB/GYN and let them know that you were exposed to [place] and that you tested negative today so they may monitor you going forward.  Please return for reevaluation for any new or worsening symptoms or follow-up with your OB/GYN.

## 2022-02-20 NOTE — ED Triage Notes (Signed)
Pt c/o being exposed to covid 4 days ago & is requesting a covid test. Had on/off bodyaches since Sunday.

## 2022-03-13 LAB — OB RESULTS CONSOLE HIV ANTIBODY (ROUTINE TESTING): HIV: NONREACTIVE

## 2022-05-09 ENCOUNTER — Encounter (HOSPITAL_COMMUNITY): Payer: Self-pay | Admitting: Obstetrics and Gynecology

## 2022-05-09 ENCOUNTER — Other Ambulatory Visit: Payer: Self-pay

## 2022-05-09 ENCOUNTER — Inpatient Hospital Stay (HOSPITAL_COMMUNITY)
Admission: AD | Admit: 2022-05-09 | Discharge: 2022-05-12 | DRG: 806 | Disposition: A | Discharge status: Home / Self Care | Payer: Medicaid Other | Attending: Obstetrics and Gynecology | Admitting: Obstetrics and Gynecology

## 2022-05-09 DIAGNOSIS — R030 Elevated blood-pressure reading, without diagnosis of hypertension: Secondary | ICD-10-CM | POA: Diagnosis present

## 2022-05-09 DIAGNOSIS — O9912 Other diseases of the blood and blood-forming organs and certain disorders involving the immune mechanism complicating childbirth: Secondary | ICD-10-CM | POA: Diagnosis present

## 2022-05-09 DIAGNOSIS — Z3A36 36 weeks gestation of pregnancy: Secondary | ICD-10-CM

## 2022-05-09 DIAGNOSIS — O1413 Severe pre-eclampsia, third trimester: Principal | ICD-10-CM

## 2022-05-09 DIAGNOSIS — D6959 Other secondary thrombocytopenia: Secondary | ICD-10-CM | POA: Diagnosis present

## 2022-05-09 DIAGNOSIS — Z3A38 38 weeks gestation of pregnancy: Secondary | ICD-10-CM | POA: Diagnosis not present

## 2022-05-09 DIAGNOSIS — O1424 HELLP syndrome, complicating childbirth: Principal | ICD-10-CM | POA: Diagnosis present

## 2022-05-09 DIAGNOSIS — Z3490 Encounter for supervision of normal pregnancy, unspecified, unspecified trimester: Secondary | ICD-10-CM

## 2022-05-09 DIAGNOSIS — O26893 Other specified pregnancy related conditions, third trimester: Secondary | ICD-10-CM | POA: Diagnosis present

## 2022-05-09 DIAGNOSIS — R0602 Shortness of breath: Secondary | ICD-10-CM | POA: Diagnosis present

## 2022-05-09 DIAGNOSIS — R079 Chest pain, unspecified: Secondary | ICD-10-CM | POA: Diagnosis present

## 2022-05-09 LAB — CBC
HCT: 32.7 % — ABNORMAL LOW (ref 36.0–46.0)
Hemoglobin: 11 g/dL — ABNORMAL LOW (ref 12.0–15.0)
MCH: 26.4 pg (ref 26.0–34.0)
MCHC: 33.6 g/dL (ref 30.0–36.0)
MCV: 78.4 fL — ABNORMAL LOW (ref 80.0–100.0)
Platelets: 104 10*3/uL — ABNORMAL LOW (ref 150–400)
RBC: 4.17 MIL/uL (ref 3.87–5.11)
RDW: 14.2 % (ref 11.5–15.5)
WBC: 7.9 10*3/uL (ref 4.0–10.5)
nRBC: 0 % (ref 0.0–0.2)

## 2022-05-09 LAB — URINALYSIS, ROUTINE W REFLEX MICROSCOPIC
Bilirubin Urine: NEGATIVE
Glucose, UA: NEGATIVE mg/dL
Hgb urine dipstick: NEGATIVE
Ketones, ur: NEGATIVE mg/dL
Nitrite: NEGATIVE
Protein, ur: NEGATIVE mg/dL
Specific Gravity, Urine: 1.011 (ref 1.005–1.030)
pH: 6 (ref 5.0–8.0)

## 2022-05-09 LAB — COMPREHENSIVE METABOLIC PANEL
ALT: 15 U/L (ref 0–44)
ALT: 16 U/L (ref 0–44)
AST: 20 U/L (ref 15–41)
AST: 18 U/L (ref 15–41)
Albumin: 2.6 g/dL — ABNORMAL LOW (ref 3.5–5.0)
Albumin: 2.6 g/dL — ABNORMAL LOW (ref 3.5–5.0)
Alkaline Phosphatase: 131 U/L — ABNORMAL HIGH (ref 38–126)
Alkaline Phosphatase: 133 U/L — ABNORMAL HIGH (ref 38–126)
Anion gap: 10 (ref 5–15)
Anion gap: 8 (ref 5–15)
BUN: 6 mg/dL (ref 6–20)
BUN: 5 mg/dL — ABNORMAL LOW (ref 6–20)
CO2: 21 mmol/L — ABNORMAL LOW (ref 22–32)
CO2: 22 mmol/L (ref 22–32)
Calcium: 8.5 mg/dL — ABNORMAL LOW (ref 8.9–10.3)
Calcium: 8.5 mg/dL — ABNORMAL LOW (ref 8.9–10.3)
Chloride: 101 mmol/L (ref 98–111)
Chloride: 106 mmol/L (ref 98–111)
Creatinine, Ser: 0.79 mg/dL (ref 0.44–1.00)
Creatinine, Ser: 0.79 mg/dL (ref 0.44–1.00)
GFR, Estimated: >60 mL/min (ref >60)
GFR, Estimated: >60 mL/min (ref >60)
Glucose, Bld: 92 mg/dL (ref 70–99)
Glucose, Bld: 74 mg/dL (ref 70–99)
Potassium: 3.7 mmol/L (ref 3.5–5.1)
Potassium: 3.8 mmol/L (ref 3.5–5.1)
Sodium: 132 mmol/L — ABNORMAL LOW (ref 135–145)
Sodium: 136 mmol/L (ref 135–145)
Total Bilirubin: 0.7 mg/dL (ref 0.3–1.2)
Total Bilirubin: 0.9 mg/dL (ref 0.3–1.2)
Total Protein: 6.1 g/dL — ABNORMAL LOW (ref 6.5–8.1)
Total Protein: 6.2 g/dL — ABNORMAL LOW (ref 6.5–8.1)

## 2022-05-09 LAB — CBC WITH DIFFERENTIAL/PLATELET
Abs Immature Granulocytes: 0.03 10*3/uL (ref 0.00–0.07)
Basophils Absolute: 0 10*3/uL (ref 0.0–0.1)
Basophils Relative: 0 %
Eosinophils Absolute: 0 10*3/uL (ref 0.0–0.5)
Eosinophils Relative: 1 %
HCT: 31.9 % — ABNORMAL LOW (ref 36.0–46.0)
Hemoglobin: 10.8 g/dL — ABNORMAL LOW (ref 12.0–15.0)
Immature Granulocytes: 0 %
Lymphocytes Relative: 16 %
Lymphs Abs: 1.1 10*3/uL (ref 0.7–4.0)
MCH: 26.4 pg (ref 26.0–34.0)
MCHC: 33.9 g/dL (ref 30.0–36.0)
MCV: 78 fL — ABNORMAL LOW (ref 80.0–100.0)
Monocytes Absolute: 0.4 10*3/uL (ref 0.1–1.0)
Monocytes Relative: 5 %
Neutro Abs: 5.4 10*3/uL (ref 1.7–7.7)
Neutrophils Relative %: 78 %
Platelets: 94 10*3/uL — ABNORMAL LOW (ref 150–400)
RBC: 4.09 MIL/uL (ref 3.87–5.11)
RDW: 14.3 % (ref 11.5–15.5)
WBC: 7 10*3/uL (ref 4.0–10.5)
nRBC: 0 % (ref 0.0–0.2)

## 2022-05-09 LAB — LIPASE, BLOOD: Lipase: 27 U/L (ref 11–51)

## 2022-05-09 LAB — PROTEIN / CREATININE RATIO, URINE
Creatinine, Urine: 96 mg/dL
Protein Creatinine Ratio: 0.1 mg/mg{Cre} (ref 0.00–0.15)
Total Protein, Urine: 10 mg/dL

## 2022-05-09 LAB — GROUP B STREP BY PCR: Group B strep by PCR: NEGATIVE

## 2022-05-09 LAB — TROPONIN I (HIGH SENSITIVITY): Troponin I (High Sensitivity): 7 ng/L (ref <18)

## 2022-05-09 LAB — AMYLASE: Amylase: 63 U/L (ref 28–100)

## 2022-05-09 MED ORDER — OXYTOCIN-SODIUM CHLORIDE 30-0.9 UT/500ML-% IV SOLN: 2.5000 [IU]/h | INTRAVENOUS | Status: DC

## 2022-05-09 MED ORDER — SODIUM CHLORIDE 0.9 % IV SOLN
2.0000 g | Freq: Once | INTRAVENOUS | Status: AC
  Administered 2022-05-09: 2 g via INTRAVENOUS
  Filled 2022-05-09: qty 2000

## 2022-05-09 MED ORDER — LIDOCAINE HCL (PF) 1 % IJ SOLN
30.0000 mL | INTRAMUSCULAR | Status: DC | PRN
Start: 2022-05-09 — End: 2022-05-09

## 2022-05-09 MED ORDER — SOD CITRATE-CITRIC ACID 500-334 MG/5ML PO SOLN
30.0000 mL | ORAL | Status: DC | PRN
Start: 2022-05-09 — End: 2022-05-09

## 2022-05-09 MED ORDER — OXYCODONE-ACETAMINOPHEN 5-325 MG PO TABS
2.0000 | ORAL_TABLET | ORAL | Status: DC | PRN
Start: 2022-05-09 — End: 2022-05-09

## 2022-05-09 MED ORDER — ONDANSETRON HCL 4 MG/2ML IJ SOLN: 4.0000 mg | Freq: Four times a day (QID) | INTRAMUSCULAR | Status: DC | PRN

## 2022-05-09 MED ORDER — OXYCODONE-ACETAMINOPHEN 5-325 MG PO TABS: 1.0000 | ORAL_TABLET | ORAL | Status: DC | PRN

## 2022-05-09 MED ORDER — LACTATED RINGERS IV SOLN: INTRAVENOUS | Status: DC

## 2022-05-09 MED ORDER — TERBUTALINE SULFATE 1 MG/ML IJ SOLN: 0.2500 mg | Freq: Once | INTRAMUSCULAR | Status: DC | PRN

## 2022-05-09 MED ORDER — ALUM & MAG HYDROXIDE-SIMETH 200-200-20 MG/5ML PO SUSP
30.0000 mL | Freq: Once | ORAL | Status: AC
  Administered 2022-05-09: 30 mL via ORAL
  Filled 2022-05-09: qty 30

## 2022-05-09 MED ORDER — ACETAMINOPHEN 325 MG PO TABS: 650.0000 mg | ORAL_TABLET | ORAL | Status: DC | PRN

## 2022-05-09 MED ORDER — LACTATED RINGERS IV SOLN
500.0000 mL | INTRAVENOUS | Status: DC | PRN
Start: 2022-05-09 — End: 2022-05-09

## 2022-05-09 MED ORDER — OXYTOCIN BOLUS FROM INFUSION: 333.0000 mL | Freq: Once | INTRAVENOUS | Status: DC

## 2022-05-09 MED ORDER — LABETALOL HCL 5 MG/ML IV SOLN: 80.0000 mg | INTRAVENOUS | Status: DC | PRN

## 2022-05-09 MED ORDER — OXYCODONE-ACETAMINOPHEN 5-325 MG PO TABS: 2.0000 | ORAL_TABLET | ORAL | Status: DC | PRN

## 2022-05-09 MED ORDER — LABETALOL HCL 5 MG/ML IV SOLN: 20.0000 mg | INTRAVENOUS | Status: DC | PRN

## 2022-05-09 MED ORDER — OXYTOCIN-SODIUM CHLORIDE 30-0.9 UT/500ML-% IV SOLN
1.0000 m[IU]/min | INTRAVENOUS | Status: DC
  Administered 2022-05-09: 2 m[IU]/min via INTRAVENOUS
  Filled 2022-05-09: qty 500

## 2022-05-09 MED ORDER — LIDOCAINE HCL (PF) 1 % IJ SOLN: 30.0000 mL | INTRAMUSCULAR | Status: DC | PRN

## 2022-05-09 MED ORDER — OXYTOCIN BOLUS FROM INFUSION
333.0000 mL | Freq: Once | INTRAVENOUS | Status: AC
  Administered 2022-05-10: 333 mL via INTRAVENOUS

## 2022-05-09 MED ORDER — HYDRALAZINE HCL 20 MG/ML IJ SOLN: 10.0000 mg | INTRAMUSCULAR | Status: DC | PRN

## 2022-05-09 MED ORDER — NIFEDIPINE ER OSMOTIC RELEASE 30 MG PO TB24
30.0000 mg | ORAL_TABLET | Freq: Every day | ORAL | Status: DC
  Administered 2022-05-09: 30 mg via ORAL
  Filled 2022-05-09: qty 1

## 2022-05-09 MED ORDER — FENTANYL CITRATE (PF) 100 MCG/2ML IJ SOLN: 50.0000 ug | INTRAMUSCULAR | Status: DC | PRN

## 2022-05-09 MED ORDER — LACTATED RINGERS IV SOLN
500.0000 mL | INTRAVENOUS | Status: DC | PRN
  Administered 2022-05-10: 1000 mL via INTRAVENOUS

## 2022-05-09 MED ORDER — LABETALOL HCL 5 MG/ML IV SOLN: 40.0000 mg | INTRAVENOUS | Status: DC | PRN

## 2022-05-09 MED ORDER — SODIUM CHLORIDE 0.9 % IV SOLN
1.0000 g | INTRAVENOUS | Status: DC
  Administered 2022-05-09 – 2022-05-10 (×2): 1 g via INTRAVENOUS
  Filled 2022-05-09 (×2): qty 1000

## 2022-05-09 MED ORDER — SOD CITRATE-CITRIC ACID 500-334 MG/5ML PO SOLN: 30.0000 mL | ORAL | Status: DC | PRN

## 2022-05-09 NOTE — Plan of Care (Signed)

## 2022-05-09 NOTE — H&P (Signed)
Tami Murphy is a 27 y.o. female G2P1001 who initially presented to MAU with complaint of CP and SOB. Though symptoms resolved she was subsequently noted to have new onset of elevated BP x 4 hrs. Pt with known thrombocytopenia but level lower in MAU. Hx preE. On baby asa. GBS unk.  Pt transferred into our care at 27 wks  NIPt expected range  OB History     Gravida  2   Para  1   Term  1   Preterm  0   AB  0   Living  1      SAB  0   IAB  0   Ectopic  0   Multiple  0   Live Births  1          Past Medical History:  Diagnosis Date   Pregnancy induced hypertension    Past Surgical History:  Procedure Laterality Date   CHOLECYSTECTOMY N/A 06/20/2017   Procedure: LAPAROSCOPIC CHOLECYSTECTOMY WITH INTRAOPERATIVE CHOLANGIOGRAM;  Surgeon: Ancil Linsey, MD;  Location: [facility] ORS;  Service: General;  Laterality: N/A;   Family History: family history includes Alzheimer's disease in her maternal great-grandmother; CVA in her maternal great-grandfather; Cancer in her maternal grandfather; Diabetes in her paternal grandfather and paternal grandmother; Healthy in her brother, father, mother, and sister; Hypertension in her paternal grandfather and paternal grandmother; Kidney disease in her paternal grandfather; Pancreatic cancer in her maternal grandfather. Social History:  reports that she has never smoked. She has been exposed to tobacco smoke. She has never used smokeless tobacco. She reports that she does not currently use alcohol after a past usage of about 2.0 standard drinks of alcohol per week. She reports current drug use. Frequency: 3.00 times per week. Drug: Marijuana.     Maternal Diabetes: No Genetic Screening: Normal Maternal Ultrasounds/Referrals: Normal Fetal Ultrasounds or other Referrals:  None Maternal Substance Abuse:  No Significant Maternal Medications:  None Significant Maternal Lab Results:  Other: GBS unk  Number of Prenatal Visits:greater than 3  verified prenatal visits Other Comments:  None  Review of Systems  Constitutional:  Negative for fatigue and fever.  Eyes:  Negative for photophobia and visual disturbance.  Respiratory:  Positive for chest tightness and shortness of breath.   Cardiovascular:  Positive for chest pain and leg swelling. Negative for palpitations.  Gastrointestinal:  Negative for abdominal pain.  Genitourinary:  Negative for pelvic pain.  Musculoskeletal:  Negative for back pain.  Neurological:  Negative for facial asymmetry and headaches.  Psychiatric/Behavioral:  The patient is not nervous/anxious.    Maternal Medical History:  Reason for admission: HELLP syndrome  Contractions: Frequency: irregular.   Perceived severity is mild.   Fetal activity: Perceived fetal activity is normal.   Prenatal complications: PIH.   Prenatal Complications - Diabetes: none.   Dilation: 4 Effacement (%): 70 Station: -2 Exam by:: Dr Mindi Slicker Blood pressure (!) 151/91, pulse 90, temperature 98.6 F (37 C), temperature source Oral, resp. rate 19, height 5\' 5"  (1.651 m), weight 105.9 kg, last menstrual period 08/28/2021, SpO2 100 %. Maternal Exam:  Uterine Assessment: Contraction strength is mild.  Contraction frequency is regular.  Abdomen: Patient reports no abdominal tenderness. Estimated fetal weight is AGA.   Fetal presentation: vertex Introitus: Normal vulva. Vulva is negative for condylomata and lesion.  Normal vagina.  Pelvis: adequate for delivery.   Cervix: Cervix evaluated by digital exam.     Fetal Exam Fetal Monitor Review: Baseline rate: 145.  Variability: moderate (  6-25 bpm).   Pattern: accelerations present and no decelerations.   Fetal State Assessment: Category I - tracings are normal.   Physical Exam Vitals and nursing note reviewed.  Constitutional:      Appearance: She is well-developed and normal weight.  Pulmonary:     Effort: Pulmonary effort is normal.  Abdominal:     Palpations:  Abdomen is soft.  Genitourinary:    General: Normal vulva.  Vulva is no lesion.  Musculoskeletal:        General: Normal range of motion.  Skin:    Capillary Refill: Capillary refill takes 2 to 3 seconds.  Neurological:     General: No focal deficit present.     Mental Status: She is alert and oriented to person, place, and time.  Psychiatric:        Mood and Affect: Mood normal.        Behavior: Behavior normal.     Prenatal labs: ABO, Rh:   Antibody:   Rubella:   RPR:    HBsAg:    HIV:    GBS:     Assessment/Plan: 26yo G73P1001 female with elevated BP and low plts - Differential includes HELLP, ghtn, benign gestational thrombocytopenia  -GBS unk - drawn rapid ; start on antibx - Pain control per pt request - Discussed expectations with delivery , respiratory assistance for baby if needed and possible short stay in NICU given GA.  - Pitocin for induction    Janean Sark Steffany Schoenfelder 05/09/2022, 7:38 PM

## 2022-05-09 NOTE — MAU Note (Signed)
Tami Murphy is a 27 y.o. at [redacted]w[redacted]d here in MAU reporting: she's having chest pain and SOB.  Reports chest pain is intermittent and is located in center of chest, states pain feels like chest "is caving in".   Denies VB or LOF.  Endorses +FM. LMP: NA Onset of complaint: Monday Pain score: 8 Vitals:   05/09/22 1403  BP: (!) 140/77  Pulse: (!) 115  Resp: 19  Temp: 98.1 F (36.7 C)  SpO2: 100%     FHT:144 bpm Lab orders placed from triage:   UA

## 2022-05-09 NOTE — MAU Provider Note (Addendum)
History   Chief Complaint  Patient presents with   Shortness of Breath   Chest Pain   Shortness of Breath Associated symptoms include chest pain. Pertinent negatives include no fever, headaches, leg swelling or vomiting.   Tami Murphy is a 27 y/o at [redacted]w[redacted]d G2P1001 presenting with intermittent chest pain and SOB. Patient reports her CP started on Monday after an appointment. She reports feeling that her chest is "caving in" and describes the pain as sharp with breathing, feelings of pressure and as an 8-9/10 when experiencing the pain. She reports no pain during interview.   Of note, patient reports heartburn earlier this pregnancy where she experienced pain in her breast area. She endorses this pain feels similar but locates her current pain to her epigastric region. She states laying down made her former heartburn worse whereas with this pain she endorses sitting up feels worse with fetal movements. She said she used Tums earlier in her pregnancy, which helped intermittently. She reports she is out of Tums, so she did not use any since the onset of CP/SOB symptoms.  Patient reports experiencing pre-eclampsia at 38 weeks with her prior pregnancy. She endorses taking her baby ASA and prenatal vitamins.   Additionally, patient reports SOB when experiencing CP. She said when she takes deep breaths, she feels pressure and difficulty breathing. Patient reports last CP/SOB episode was around 2100 last night and lasted for ~20-30 minutes. Patient denies fever, diaphoresis, radiating pain or new foods. Patient denies HA, vision changes or N/V. Patient denies VB and LOF and endorses FM and contractions for the past 2 weeks.   OB History     Gravida  2   Para  1   Term  1   Preterm  0   AB  0   Living  1      SAB  0   IAB  0   Ectopic  0   Multiple  0   Live Births  1          Past Medical History:  Diagnosis Date   Pregnancy induced hypertension    Past Surgical History:   Procedure Laterality Date   CHOLECYSTECTOMY N/A 06/20/2017   Procedure: LAPAROSCOPIC CHOLECYSTECTOMY WITH INTRAOPERATIVE CHOLANGIOGRAM;  Surgeon: Ancil Linsey, MD;  Location: [facility] ORS;  Service: General;  Laterality: N/A;   Family History  Problem Relation Age of Onset   Diabetes Paternal Grandfather    Hypertension Paternal Grandfather    Kidney disease Paternal Grandfather    Hypertension Paternal Grandmother    Diabetes Paternal Grandmother    Cancer Maternal Grandfather    Pancreatic cancer Maternal Grandfather    Healthy Father    Healthy Mother    Healthy Brother    Healthy Sister    CVA Maternal Great-grandfather    Alzheimer's disease Maternal Great-grandmother    Social History   Tobacco Use   Smoking status: Never    Passive exposure: Current (fiance mom is a smoker and fiance vapes)   Smokeless tobacco: Never  Vaping Use   Vaping Use: Never used  Substance Use Topics   Alcohol use: Not Currently    Alcohol/week: 2.0 standard drinks of alcohol    Types: 2 Standard drinks or equivalent per week    Comment: "I don't drink"   Drug use: Yes    Frequency: 3.0 times per week    Types: Marijuana    Comment: stopped mj 09/26/21 when found out preg   Allergies:  Allergies  Allergen Reactions   Bactrim [Sulfamethoxazole-Trimethoprim] Hives   Medications Prior to Admission  Medication Sig Dispense Refill Last Dose   aspirin 81 MG chewable tablet Chew 81 mg by mouth daily.   Past Week   Review of Systems  Constitutional:  Negative for diaphoresis and fever.  Respiratory:  Positive for shortness of breath.   Cardiovascular:  Positive for chest pain. Negative for leg swelling.  Gastrointestinal:  Negative for blood in stool, nausea and vomiting.  Genitourinary:  Negative for difficulty urinating, hematuria and vaginal bleeding.  Neurological:  Negative for headaches.   Physical Exam Blood pressure (!) 151/91, pulse 90, temperature 98.6 F (37 C), temperature  source Oral, resp. rate 19, height 5\' 5"  (1.651 m), weight 105.9 kg, last menstrual period 08/28/2021, SpO2 100 %. Physical Exam Constitutional:      Appearance: She is well-developed.  Cardiovascular:     Rate and Rhythm: Regular rhythm. Tachycardia present.  Pulmonary:     Effort: Pulmonary effort is normal.     Breath sounds: Normal breath sounds.  Abdominal:     General: Bowel sounds are normal.     Palpations: Abdomen is soft.  Musculoskeletal:        General: Normal range of motion.     Right lower leg: No edema.     Left lower leg: No edema.  Skin:    General: Skin is warm and dry.  Neurological:     Mental Status: She is alert.   MAU Course Procedures  MDM - Protein Creatinine Ratio: 0.10 - Troponin I: 7 - CBC: Hgb: 10.8; Plts: 94  - VHQ:[id number] - UA: Appearance: Hazy; Leukocytes: Trace; Bacteria: Rare - EKG: Unremarkable - BP Monitoring: Elevated but not severe  Low suspicion of cardiac etiology due to patient's location of epigastric pain, normal troponin and EKG. Concerned for PE due to SOB and tachycardia; however, absent unilateral leg swelling/warmth, intermittent SOB and denied recent immobility makes PE less likely. Pt reports hx of heartburn/GERD in pregnancy. Given pain in epigastric region affected by positional changes and feelings of pressure, esophageal spasms secondary to acid reflux/GERD is possible. Maalox/mylanta was given for possible acid reflux.  Patient also has new onset elevated BP in the MAU. Additionally, pt has trending thrombocytopenia. Per chart review, plt trending below:  11/26/21 163 03/13/22 140 04/11/22 115 05/09/22 94   Due to elevated BP for >4 hours and plt count now less than 100, concerned for pre-eclampsia. Pt reported pre-e with her prior pregnancy at 38 weeks. Epigastric pain could be consistent with pre-eclampsia. Admission was discussed with Dr. Despina Hidden, MAU attending and Dr. Mindi Slicker, on-call [facility] attending.  Both agreed with admission to L&D for pre-eclampsia. Patient was updated and agreed to admission.   A&P Chest Pain SOB Newly elevated BP and thrombocytopenia (<100) concerning for pre-eclampsia  [redacted] weeks gestation of pregnancy   - Admit to labor and delivery - Dr. Mindi Slicker to follow   I was present for the exam and agree with above.   EFM: Baseline: 135 bpm, Variability: Good {> 6 bpm), Accelerations: Reactive, and Decelerations: Absent Toco: none  Katrinka Blazing, [place], CNM 05/09/2022 8:19 PM   Katrinka Blazing, [place], CNM 05/09/2022 8:19 PM

## 2022-05-10 ENCOUNTER — Inpatient Hospital Stay (HOSPITAL_COMMUNITY): Payer: Medicaid Other | Admitting: Anesthesiology

## 2022-05-10 ENCOUNTER — Encounter (HOSPITAL_COMMUNITY): Payer: Self-pay | Admitting: Obstetrics and Gynecology

## 2022-05-10 LAB — CBC
HCT: 32.8 % — ABNORMAL LOW (ref 36.0–46.0)
Hemoglobin: 11.2 g/dL — ABNORMAL LOW (ref 12.0–15.0)
MCH: 26.6 pg (ref 26.0–34.0)
MCHC: 34.1 g/dL (ref 30.0–36.0)
MCV: 77.9 fL — ABNORMAL LOW (ref 80.0–100.0)
Platelets: 90 10*3/uL — ABNORMAL LOW (ref 150–400)
RBC: 4.21 MIL/uL (ref 3.87–5.11)
RDW: 14.2 % (ref 11.5–15.5)
WBC: 8.9 10*3/uL (ref 4.0–10.5)
nRBC: 0 % (ref 0.0–0.2)

## 2022-05-10 LAB — PROTEIN / CREATININE RATIO, URINE
Creatinine, Urine: 58 mg/dL
Total Protein, Urine: <6 mg/dL

## 2022-05-10 MED ORDER — LACTATED RINGERS IV SOLN
500.0000 mL | Freq: Once | INTRAVENOUS | Status: AC
  Administered 2022-05-10: 500 mL via INTRAVENOUS

## 2022-05-10 MED ORDER — EPHEDRINE 5 MG/ML INJ: 10.0000 mg | INTRAVENOUS | Status: DC | PRN

## 2022-05-10 MED ORDER — WITCH HAZEL-GLYCERIN EX PADS: 1.0000 | MEDICATED_PAD | CUTANEOUS | Status: DC | PRN

## 2022-05-10 MED ORDER — DIBUCAINE (PERIANAL) 1 % EX OINT
1.0000 | TOPICAL_OINTMENT | CUTANEOUS | Status: DC | PRN
  Filled 2022-05-10: qty 28

## 2022-05-10 MED ORDER — ONDANSETRON HCL 4 MG/2ML IJ SOLN: 4.0000 mg | INTRAMUSCULAR | Status: DC | PRN

## 2022-05-10 MED ORDER — PHENYLEPHRINE 80 MCG/ML (10ML) SYRINGE FOR IV PUSH (FOR BLOOD PRESSURE SUPPORT): 80.0000 ug | PREFILLED_SYRINGE | INTRAVENOUS | Status: DC | PRN

## 2022-05-10 MED ORDER — COCONUT OIL OIL: 1.0000 | TOPICAL_OIL | Status: DC | PRN

## 2022-05-10 MED ORDER — TETANUS-DIPHTH-ACELL PERTUSSIS 5-2.5-18.5 LF-MCG/0.5 IM SUSY: 0.5000 mL | PREFILLED_SYRINGE | Freq: Once | INTRAMUSCULAR | Status: DC

## 2022-05-10 MED ORDER — BENZOCAINE-MENTHOL 20-0.5 % EX AERO
1.0000 | INHALATION_SPRAY | CUTANEOUS | Status: DC | PRN
  Filled 2022-05-10: qty 56

## 2022-05-10 MED ORDER — DIPHENHYDRAMINE HCL 50 MG/ML IJ SOLN: 12.5000 mg | INTRAMUSCULAR | Status: DC | PRN

## 2022-05-10 MED ORDER — FENTANYL-BUPIVACAINE-NACL 0.5-0.125-0.9 MG/250ML-% EP SOLN
12.0000 mL/h | EPIDURAL | Status: DC | PRN
  Administered 2022-05-10: 12 mL/h via EPIDURAL
  Filled 2022-05-10: qty 250

## 2022-05-10 MED ORDER — OXYCODONE HCL 5 MG PO TABS: 10.0000 mg | ORAL_TABLET | ORAL | Status: DC | PRN

## 2022-05-10 MED ORDER — IBUPROFEN 600 MG PO TABS
600.0000 mg | ORAL_TABLET | Freq: Four times a day (QID) | ORAL | Status: DC
  Administered 2022-05-11 – 2022-05-12 (×5): 600 mg via ORAL
  Filled 2022-05-10 (×7): qty 1

## 2022-05-10 MED ORDER — SIMETHICONE 80 MG PO CHEW: 80.0000 mg | CHEWABLE_TABLET | ORAL | Status: DC | PRN

## 2022-05-10 MED ORDER — FLEET ENEMA 7-19 GM/118ML RE ENEM: 1.0000 | ENEMA | Freq: Every day | RECTAL | Status: DC | PRN

## 2022-05-10 MED ORDER — BISACODYL 10 MG RE SUPP: 10.0000 mg | Freq: Every day | RECTAL | Status: DC | PRN

## 2022-05-10 MED ORDER — BUPIVACAINE HCL (PF) 0.25 % IJ SOLN
INTRAMUSCULAR | Status: DC | PRN
  Administered 2022-05-10 (×2): 3 mL via EPIDURAL

## 2022-05-10 MED ORDER — DIPHENHYDRAMINE HCL 25 MG PO CAPS: 25.0000 mg | ORAL_CAPSULE | Freq: Four times a day (QID) | ORAL | Status: DC | PRN

## 2022-05-10 MED ORDER — DOCUSATE SODIUM 100 MG PO CAPS
100.0000 mg | ORAL_CAPSULE | Freq: Two times a day (BID) | ORAL | Status: DC
  Administered 2022-05-11 – 2022-05-12 (×3): 100 mg via ORAL
  Filled 2022-05-10 (×3): qty 1

## 2022-05-10 MED ORDER — ACETAMINOPHEN 325 MG PO TABS
650.0000 mg | ORAL_TABLET | ORAL | Status: DC | PRN
  Administered 2022-05-10 – 2022-05-11 (×3): 650 mg via ORAL
  Filled 2022-05-10 (×4): qty 2

## 2022-05-10 MED ORDER — LIDOCAINE-EPINEPHRINE (PF) 2 %-1:200000 IJ SOLN
INTRAMUSCULAR | Status: DC | PRN
  Administered 2022-05-10: 3 mL via EPIDURAL

## 2022-05-10 MED ORDER — OXYCODONE HCL 5 MG PO TABS
5.0000 mg | ORAL_TABLET | ORAL | Status: DC | PRN
  Administered 2022-05-10: 5 mg via ORAL
  Filled 2022-05-10: qty 1

## 2022-05-10 MED ORDER — PRENATAL MULTIVITAMIN CH
1.0000 | ORAL_TABLET | Freq: Every day | ORAL | Status: DC
  Administered 2022-05-11 – 2022-05-12 (×2): 1 via ORAL
  Filled 2022-05-10 (×2): qty 1

## 2022-05-10 MED ORDER — ONDANSETRON HCL 4 MG PO TABS: 4.0000 mg | ORAL_TABLET | ORAL | Status: DC | PRN

## 2022-05-10 NOTE — Progress Notes (Signed)
Patient ID: Tami Murphy, female   DOB: 14-Jul-1995, 27 y.o.   MRN: [id number] At 4am, pt noted to be completely dilated after fetal heart rate deceleration was noted.  FHTs improved with postion change and pitocin reduced from 18 to .   Trial of pushing was attempted but no descent noted  Pt allowed to labor down for an hour and then pushing now resumed.

## 2022-05-10 NOTE — Anesthesia Procedure Notes (Signed)
Epidural Patient location during procedure: OB Start time: 05/10/2022 12:54 AM End time: 05/10/2022 1:06 AM  Staffing Anesthesiologist: Val Eagle, MD Performed: anesthesiologist   Preanesthetic Checklist Completed: patient identified, IV checked, risks and benefits discussed, monitors and equipment checked, pre-op evaluation and timeout performed  Epidural Patient position: sitting Prep: DuraPrep Patient monitoring: heart rate, continuous pulse ox and blood pressure Approach: midline Location: L4-L5 Injection technique: LOR saline  Needle:  Needle type: Tuohy  Needle gauge: 17 G Needle length: 9 cm Needle insertion depth: 8 cm Catheter type: closed end flexible Catheter size: 19 Gauge Catheter at skin depth: 14 cm Test dose: negative and 2% lidocaine with Epi 1:200 K  Assessment Events: blood not aspirated, no cerebrospinal fluid, injection not painful, no injection resistance, no paresthesia and negative IV test  Additional Notes Reason for block:procedure for pain

## 2022-05-10 NOTE — Anesthesia Postprocedure Evaluation (Signed)
Anesthesia Post Note  Patient: Tami Murphy  Procedure(s) Performed: AN AD HOC LABOR EPIDURAL     Patient location during evaluation: Mother Baby Anesthesia Type: Epidural Level of consciousness: awake and alert Pain management: pain level controlled Vital Signs Assessment: post-procedure vital signs reviewed and stable Respiratory status: spontaneous breathing, nonlabored ventilation and respiratory function stable Cardiovascular status: stable Postop Assessment: no headache, no backache, epidural receding, no apparent nausea or vomiting, patient able to bend at knees, able to ambulate and adequate PO intake Anesthetic complications: no   No notable events documented.  Last Vitals:  Vitals:   05/10/22 0945 05/10/22 1048  BP: 137/81 134/78  Pulse: 91 94  Resp:  18  Temp:  36.8 C  SpO2: 100% 100%    Last Pain:  Vitals:   05/10/22 1622  TempSrc:   PainSc: Asleep   Pain Goal:                   Laban Emperor

## 2022-05-10 NOTE — Progress Notes (Signed)
Patient ID: Tami Murphy, female   DOB: June 07, 1995, 27 y.o.   MRN: [id number] Pt doing well. No complaints. S/P second dose of ampicillin VSS: 136-148/74-83 EFM - 140, +accels, = decels, cat 1 TOCO - cttxs q 3-89mins  SVE - 5/75/-2  A/P: Progressing well on pitocin now at    - AROM performed with clear fluid noted     - continue with expectant mgmt     - pain control prn pt request

## 2022-05-10 NOTE — Progress Notes (Signed)
Tami Murphy is a 27 y.o. G2P1001 at [redacted]w[redacted]d   Subjective: Pt comfortable with no complaints.   Objective: BP (!) 144/77   Pulse (!) 102   Temp 97.9 F (36.6 C) (Oral)   Resp 18   Ht 5\' 5"  (1.651 m)   Wt 105.9 kg   LMP 08/28/2021 (Exact Date)   SpO2 100%   BMI 38.84 kg/m  No intake/output data recorded. No intake/output data recorded.  FHT:  FHR: 135 bpm, variability: moderate,  accelerations:  Present,  decelerations:  Present variable UC:   regular, every 2-3 minutes SVE:   Dilation: 7 Effacement (%): 90 Station: -2 Exam by:: Dr Mindi Slicker  Labs: Lab Results  Component Value Date   WBC 7.9 05/09/2022   HGB 11.0 (L) 05/09/2022   HCT 32.7 (L) 05/09/2022   MCV 78.4 (L) 05/09/2022   PLT 104 (L) 05/09/2022    Assessment / Plan: Induction of labor due to HELLP,  progressing well on pitocin  Labor: Progressing normally; on pitocin  Preeclampsia:  no signs or symptoms of toxicity Fetal Wellbeing:  Category I Pain Control:  Epidural I/D:  n/a Anticipated MOD:  NSVD  [name], DO 05/10/2022, 2:51 AM

## 2022-05-10 NOTE — Lactation Note (Signed)
This note was copied from a baby's chart. Lactation Consultation Note  Patient Name: Tami Murphy Date: 05/10/2022 Age:27 years Reason for consult: Initial assessment;Late-preterm 34-36.6wks  P2, LPTI Birth Parent feeding preference is " Pumping Only and offering 22 kcal formula to infant. LC assisted with using DEBP, Birth Parent was fitted with 24 mm breast flange and was pumping as LC was in the room. Birth Parent will continue to pump every 3 hours for 15 minutes on initial setting and knows to give infant any EBM first before offering formula. Infant is LPTI and Birth Parent will continue to feed infant 8+ times within 24 hours, STS and do go past 3 hours without feeding infant. Based on infant's Birth weight, Birth Parent will offer ( 14 mls EBM/22 kcal formula) per feeding or more if infant wants it. LC reviewed " MY Plan" crib card for LPTI's. LC discussed importance of maternal rest, diet and hydration with Birth Parent. LC sent referral for Stork DEBP with [facility]. Birth Parent was made aware of O/P services, breastfeeding support groups, community resources, and our phone # for post-discharge questions.    Maternal Data Has patient been taught Hand Expression?: Yes Does the patient have breastfeeding experience prior to this delivery?: Yes How long did the patient breastfeed?: Per Birth Parent, she briefly BF 1st child for 1 week.  Feeding Mother's Current Feeding Choice: Breast Milk and Formula  LATCH Score  Birth Parent is not latching infant at the breast, Her feeding choice is "Pumping Only" and formula feeding infant.                   Lactation Tools Discussed/Used Tools: Flanges;Pump Flange Size: 24 Breast pump type: Double-Electric Breast Pump Pump Education: Setup, frequency, and cleaning;Milk Storage Reason for Pumping: Birth Parent's feeding choice is "Pumping only" and formula feeding infant. Pumping frequency: Birth Parent will continue to  use DEBP every 3 hours for 15 minutes on inital setting.  Interventions Interventions: Breast feeding basics reviewed;Skin to skin;Education;Pace feeding;DEBP;LC Services brochure  Discharge Pump: Manual (LC sent BF referral for Stork DEBP to [facility].)  Consult Status Consult Status: Follow-up Date: 05/11/22 Follow-up type: In-patient    Tami Murphy 05/10/2022, 6:51 PM

## 2022-05-10 NOTE — Anesthesia Preprocedure Evaluation (Signed)
Anesthesia Evaluation  Patient identified by MRN, date of birth, ID band Patient awake    Reviewed: Allergy & Precautions, Patient's Chart, lab work & pertinent test results  History of Anesthesia Complications Negative for: history of anesthetic complications  Airway Mallampati: III  TM Distance: >3 FB Neck ROM: Full    Dental  (+) Dental Advisory Given   Pulmonary neg pulmonary ROS   breath sounds clear to auscultation       Cardiovascular hypertension,  Rhythm:Regular     Neuro/Psych negative neurological ROS  negative psych ROS   GI/Hepatic negative GI ROS, Neg liver ROS,,,  Endo/Other  negative endocrine ROS    Renal/GU negative Renal ROS     Musculoskeletal   Abdominal   Peds  Hematology  (+) Blood dyscrasia, anemia Lab Results      Component                Value               Date                      WBC                      7.9                 05/09/2022                HGB                      11.0 (L)            05/09/2022                HCT                      32.7 (L)            05/09/2022                MCV                      78.4 (L)            05/09/2022                PLT                      104 (L)             05/09/2022              Anesthesia Other Findings   Reproductive/Obstetrics (+) Pregnancy                             Anesthesia Physical Anesthesia Plan  ASA: 2  Anesthesia Plan: Epidural   Post-op Pain Management:    Induction:   PONV Risk Score and Plan: 2 and Treatment may vary due to age or medical condition  Airway Management Planned: Natural Airway  Additional Equipment: None  Intra-op Plan:   Post-operative Plan:   Informed Consent: I have reviewed the patients History and Physical, chart, labs and discussed the procedure including the risks, benefits and alternatives for the proposed anesthesia with the patient or authorized  representative who has indicated his/her understanding and acceptance.       Plan Discussed with:   Anesthesia Plan  Comments:        Anesthesia Quick Evaluation

## 2022-05-11 LAB — TYPE AND SCREEN
ABO/RH(D): B POS
Antibody Screen: NEGATIVE

## 2022-05-11 LAB — CBC
HCT: 30.1 % — ABNORMAL LOW (ref 36.0–46.0)
Hemoglobin: 10.1 g/dL — ABNORMAL LOW (ref 12.0–15.0)
MCH: 26.4 pg (ref 26.0–34.0)
MCHC: 33.6 g/dL (ref 30.0–36.0)
MCV: 78.6 fL — ABNORMAL LOW (ref 80.0–100.0)
Platelets: 111 10*3/uL — ABNORMAL LOW (ref 150–400)
RBC: 3.83 MIL/uL — ABNORMAL LOW (ref 3.87–5.11)
RDW: 14.6 % (ref 11.5–15.5)
WBC: 9.1 10*3/uL (ref 4.0–10.5)
nRBC: 0 % (ref 0.0–0.2)

## 2022-05-11 MED ORDER — HYDROCORTISONE ACETATE 25 MG RE SUPP: 25.0000 mg | Freq: Two times a day (BID) | RECTAL | Status: DC | PRN

## 2022-05-11 NOTE — Progress Notes (Signed)
Patient is doing well.  She is ambulating, voiding, tolerating PO.  Pain control is good.  Lochia is appropriate  Vitals:   05/10/22 1048 05/10/22 1845 05/10/22 2305 05/11/22 0605  BP: 134/78 132/81 139/88 136/87  Pulse: 94 96 (!) 105 93  Resp: 18 16 18 18   Temp: 98.2 F (36.8 C) 98.4 F (36.9 C) (!) 97.4 F (36.3 C) 97.7 F (36.5 C)  TempSrc:  Oral Oral Oral  SpO2: 100% 97% 100% 99%  Weight:      Height:        NAD Fundus firm Ext: no edema  Lab Results  Component Value Date   WBC 9.1 05/11/2022   HGB 10.1 (L) 05/11/2022   HCT 30.1 (L) 05/11/2022   MCV 78.6 (L) 05/11/2022   PLT 111 (L) 05/11/2022     /  A/P 26 y.o. [id number] PPD#1 s/p SVD at 36 weeks for possible atypical HELLP vs preeclampsia vs thrombocytopenia POssible atypical HELLP:  BP have normalized since admission.  Platelets are trending up from 90 to 111 this AM.  Will continue to monitor BP closely today.  Anticipate discharge tomorrow    No admission T&S or RPR drawn--will draw now  [facility] GEFFEL [facility]

## 2022-05-11 NOTE — Progress Notes (Signed)
CSW received consult for hx of marijuana use. Referral was screened out due to the following: ~MOB had no documented substance use after initial prenatal visit/+UPT. ~MOB had no positive drug screens after initial prenatal visit/+UPT. ~Baby's UDS is negative.  Per chart review, MOB reports her last use of marijuana was 09/2021. "Stopped using when found out she was pregnant."  CSW will continue to monitor infant's CDS and make a CPS report if warranted.  Please contact the Clinical Social Worker if needs arise, by MOB request, or if MOB scores greater than 9/yes to question 10 on Edinburgh Postpartum Depression Screen.  Signed,  Tyshika Baldridge K. Shanekqua Schaper, MSW, LCSWA, LCASA 05/11/2022 9:11 AM 

## 2022-05-12 LAB — RPR: RPR Ser Ql: NONREACTIVE

## 2022-05-12 NOTE — Progress Notes (Signed)
Patient is doing well.  She is ambulating, voiding, tolerating PO.  Pain control is good.  Lochia is appropriate  Vitals:   05/11/22 0605 05/11/22 1300 05/11/22 2024 05/12/22 0515  BP: 136/87 130/75 136/80 130/84  Pulse: 93 96 96 98  Resp: 18  18 18   Temp: 97.7 F (36.5 C) 98.3 F (36.8 C) 98.1 F (36.7 C) 97.9 F (36.6 C)  TempSrc: Oral Oral Oral Oral  SpO2: 99% 100% 100% 99%  Weight:      Height:        NAD Fundus firm Ext: no edema  Lab Results  Component Value Date   WBC 9.1 05/11/2022   HGB 10.1 (L) 05/11/2022   HCT 30.1 (L) 05/11/2022   MCV 78.6 (L) 05/11/2022   PLT 111 (L) 05/11/2022    --/--/B POS (05/04 0820)/  A/P 26 y.o. [id number] PPD#2 s/p SVD at 36 weeks for possible atypical HELLP vs preeclampsia vs thrombocytopenia Possible atypical HELLP:  BP have normalized since admission.  Platelets trended up from 90 to 111 post delivery.   Meeting all goals.  Discharge to home today.  [facility] GEFFEL [employer]

## 2022-05-12 NOTE — Discharge Summary (Signed)
Postpartum Discharge Summary  Date of Service updated 05/12/22     Patient Name: Tami Murphy DOB: December 19, 1995 MRN: [id number]  Date of admission: 05/09/2022 Delivery date:05/10/2022  Delivering provider: Derl Barrow E  Date of discharge: 05/12/2022  Admitting diagnosis: Pregnancy [Z34.90] Intrauterine pregnancy: [redacted]w[redacted]d     Secondary diagnosis:  Principal Problem:   Pregnancy Active Problems: Possible HELLP  Additional problems: none    Discharge diagnosis: Preterm Pregnancy Delivered                                              Post partum procedures: n/a Augmentation: AROM and Pitocin Complications: None  Hospital course: Induction of Labor With Vaginal Delivery   27 y.o. yo [id number] at [redacted]w[redacted]d was admitted to the hospital 05/09/2022 for induction of labor.  Indication for induction:  possible atypical HELLP with low platelets .  Patient had an labor course complicated by n/a Membrane Rupture Time/Date: 12:04 AM ,05/10/2022   Delivery Method:Vaginal, Spontaneous  Episiotomy: None  Lacerations:  None  Details of delivery can be found in separate delivery note.  Patient had a postpartum course complicated by n/a. Patient is discharged home 05/12/22.  Newborn Data: Birth date:05/10/2022  Birth time:7:20 AM  Gender:Female  Living status:Living  Apgars:8 ,9  Weight:3450 g   Magnesium Sulfate received: No   Physical exam  Vitals:   05/11/22 0605 05/11/22 1300 05/11/22 2024 05/12/22 0515  BP: 136/87 130/75 136/80 130/84  Pulse: 93 96 96 98  Resp: 18  18 18   Temp: 97.7 F (36.5 C) 98.3 F (36.8 C) 98.1 F (36.7 C) 97.9 F (36.6 C)  TempSrc: Oral Oral Oral Oral  SpO2: 99% 100% 100% 99%  Weight:      Height:       General: alert, cooperative, and no distress Lochia: appropriate Uterine Fundus: firm Incision: N/A DVT Evaluation: No evidence of DVT seen on physical exam. Labs: Lab Results  Component Value Date   WBC 9.1 05/11/2022   HGB 10.1 (L) 05/11/2022   HCT  30.1 (L) 05/11/2022   MCV 78.6 (L) 05/11/2022   PLT 111 (L) 05/11/2022      Latest Ref Rng & Units 05/09/2022    7:49 PM  CMP  Glucose 70 - 99 mg/dL 74   BUN 6 - 20 mg/dL 5   Creatinine 1.47 - 8.29 mg/dL 5.62   Sodium 130 - 865 mmol/L 136   Potassium 3.5 - 5.1 mmol/L 3.8   Chloride 98 - 111 mmol/L 106   CO2 22 - 32 mmol/L 22   Calcium 8.9 - 10.3 mg/dL 8.5   Total Protein 6.5 - 8.1 g/dL 6.2   Total Bilirubin 0.3 - 1.2 mg/dL 0.9   Alkaline Phos 38 - 126 U/L 133   AST 15 - 41 U/L 18   ALT 0 - 44 U/L 16    Edinburgh Score:    04/22/2017    9:05 PM  Edinburgh Postnatal Depression Scale Screening Tool  I have been able to laugh and see the funny side of things. 0  I have looked forward with enjoyment to things. 0  I have blamed myself unnecessarily when things went wrong. 1  I have been anxious or worried for no good reason. 0  I have felt scared or panicky for no good reason. 0  Things have been getting on top  of me. 0  I have been so unhappy that I have had difficulty sleeping. 0  I have felt sad or miserable. 1  I have been so unhappy that I have been crying. 1  The thought of harming myself has occurred to me. 1  Edinburgh Postnatal Depression Scale Total 4      After visit meds:  Allergies as of 05/12/2022       Reactions   Bactrim [sulfamethoxazole-trimethoprim] Hives        Medication List     STOP taking these medications    aspirin 81 MG chewable tablet         Discharge home in stable condition Infant Feeding: Bottle Infant Disposition: home w mom or rooming in pending peds rounding todya Discharge instruction: per After Visit Summary and Postpartum booklet. Activity: Advance as tolerated. Pelvic rest for 6 weeks.  Diet: routine diet Anticipated Birth Control: Unsure Postpartum Appointment:6 weeks Additional Postpartum F/U: BP check 1 week Future Appointments:No future appointments. Follow up Visit:      05/12/2022 [facility] GEFFEL Chestine Spore,  MD

## 2022-05-21 ENCOUNTER — Telehealth (HOSPITAL_COMMUNITY): Payer: Self-pay

## 2022-05-21 NOTE — Telephone Encounter (Signed)
Patient did not answer phone call. Voicemail left for patient.   Suann Larry [facility]   05/21/22,1546

## 2022-08-23 ENCOUNTER — Ambulatory Visit: Payer: Medicaid Other | Admitting: Cardiology
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
## 2023-11-14 ENCOUNTER — Ambulatory Visit: Admitting: Family Medicine

## 2023-11-14 ENCOUNTER — Encounter: Payer: Self-pay | Admitting: Family Medicine

## 2023-11-14 VITALS — BP 129/68 | HR 69 | Ht 65.0 in | Wt 180.0 lb

## 2023-11-14 DIAGNOSIS — Z309 Encounter for contraceptive management, unspecified: Secondary | ICD-10-CM | POA: Diagnosis not present

## 2023-11-14 DIAGNOSIS — Z3201 Encounter for pregnancy test, result positive: Secondary | ICD-10-CM | POA: Diagnosis not present

## 2023-11-14 LAB — PREGNANCY, URINE: Preg Test, Ur: POSITIVE — AB

## 2023-11-14 MED ORDER — PRENATAL VITAMIN 27-0.8 MG PO TABS
1.0000 | ORAL_TABLET | ORAL | Status: AC
Start: 2023-11-14 — End: 2023-11-15

## 2023-11-14 NOTE — Progress Notes (Signed)
 Pt positive, patient notified of results. + pregnancy packet given and reviewed with pt.  The patient was dispensed #100 PNV today. I provided counseling today regarding the medication. We discussed the medication, the side effects and when to call clinic. Patient given the opportunity to ask questions. Questions answered.  All questions answered and needs addressed. Larraine JONELLE Novak, RN
